# Patient Record
Sex: Male | Born: 1935 | Race: White | Hispanic: No | Marital: Married | Smoking: Never smoker
Health system: Southern US, Community
[De-identification: ages and names within clinical notes are randomized; demographics above are authoritative.]

## PROBLEM LIST (undated history)

## (undated) DIAGNOSIS — E079 Disorder of thyroid, unspecified: Secondary | ICD-10-CM

## (undated) DIAGNOSIS — D649 Anemia, unspecified: Secondary | ICD-10-CM

## (undated) DIAGNOSIS — I4891 Unspecified atrial fibrillation: Secondary | ICD-10-CM

## (undated) DIAGNOSIS — G500 Trigeminal neuralgia: Secondary | ICD-10-CM

## (undated) DIAGNOSIS — N400 Enlarged prostate without lower urinary tract symptoms: Secondary | ICD-10-CM

## (undated) DIAGNOSIS — N200 Calculus of kidney: Secondary | ICD-10-CM

## (undated) DIAGNOSIS — E7800 Pure hypercholesterolemia, unspecified: Secondary | ICD-10-CM

## (undated) DIAGNOSIS — J219 Acute bronchiolitis, unspecified: Secondary | ICD-10-CM

## (undated) DIAGNOSIS — I10 Essential (primary) hypertension: Secondary | ICD-10-CM

## (undated) DIAGNOSIS — J189 Pneumonia, unspecified organism: Secondary | ICD-10-CM

## (undated) DIAGNOSIS — R42 Dizziness and giddiness: Secondary | ICD-10-CM

## (undated) DIAGNOSIS — Z950 Presence of cardiac pacemaker: Secondary | ICD-10-CM

## (undated) HISTORY — DX: Benign prostatic hyperplasia without lower urinary tract symptoms: N40.0

## (undated) HISTORY — PX: CARDIAC SURGERY: SHX584

## (undated) HISTORY — DX: Unspecified atrial fibrillation: I48.91

## (undated) HISTORY — DX: Anemia, unspecified: D64.9

## (undated) HISTORY — DX: Pure hypercholesterolemia, unspecified: E78.00

## (undated) HISTORY — DX: Calculus of kidney: N20.0

## (undated) HISTORY — PX: PACEMAKER INSERTION: SHX728

## (undated) HISTORY — PX: TONSILLECTOMY: SUR1361

## (undated) HISTORY — PX: CAROTID ENDARTERECTOMY: SUR193

## (undated) HISTORY — PX: CATARACT EXTRACTION: SUR2

## (undated) HISTORY — PX: PILONIDAL CYST EXCISION: SHX744

## (undated) HISTORY — PX: HERNIA REPAIR: SHX51

## (undated) HISTORY — DX: Acute bronchiolitis, unspecified: J21.9

---

## 1995-11-18 HISTORY — PX: CHOLECYSTECTOMY: SHX55

## 1998-03-21 ENCOUNTER — Encounter: Admission: RE | Admit: 1998-03-21 | Discharge: 1998-06-19 | Payer: Self-pay | Admitting: Orthopaedic Surgery

## 1998-03-22 ENCOUNTER — Other Ambulatory Visit: Admission: RE | Admit: 1998-03-22 | Discharge: 1998-03-22 | Payer: Self-pay | Admitting: Internal Medicine

## 1998-08-04 ENCOUNTER — Inpatient Hospital Stay (HOSPITAL_COMMUNITY): Admission: EM | Admit: 1998-08-04 | Discharge: 1998-08-05 | Payer: Self-pay | Admitting: Emergency Medicine

## 1998-08-04 ENCOUNTER — Encounter: Payer: Self-pay | Admitting: Emergency Medicine

## 2000-09-03 ENCOUNTER — Ambulatory Visit (HOSPITAL_COMMUNITY): Admission: RE | Admit: 2000-09-03 | Discharge: 2000-09-03 | Payer: Self-pay | Admitting: Gastroenterology

## 2002-11-04 ENCOUNTER — Encounter: Admission: RE | Admit: 2002-11-04 | Discharge: 2002-11-04 | Payer: Self-pay | Admitting: Urology

## 2002-11-04 ENCOUNTER — Encounter: Payer: Self-pay | Admitting: Urology

## 2005-09-17 ENCOUNTER — Encounter: Admission: RE | Admit: 2005-09-17 | Discharge: 2005-09-17 | Payer: Self-pay | Admitting: Internal Medicine

## 2005-10-17 ENCOUNTER — Ambulatory Visit (HOSPITAL_COMMUNITY): Admission: RE | Admit: 2005-10-17 | Discharge: 2005-10-17 | Payer: Self-pay | Admitting: Internal Medicine

## 2006-09-30 ENCOUNTER — Encounter: Admission: RE | Admit: 2006-09-30 | Discharge: 2006-09-30 | Payer: Self-pay | Admitting: Cardiovascular Disease

## 2006-10-06 ENCOUNTER — Observation Stay (HOSPITAL_COMMUNITY): Admission: RE | Admit: 2006-10-06 | Discharge: 2006-10-07 | Payer: Self-pay | Admitting: Cardiovascular Disease

## 2006-10-17 HISTORY — PX: OTHER SURGICAL HISTORY: SHX169

## 2006-10-21 ENCOUNTER — Ambulatory Visit (HOSPITAL_COMMUNITY): Admission: RE | Admit: 2006-10-21 | Discharge: 2006-10-22 | Payer: Self-pay | Admitting: Cardiovascular Disease

## 2006-11-06 ENCOUNTER — Inpatient Hospital Stay (HOSPITAL_COMMUNITY): Admission: RE | Admit: 2006-11-06 | Discharge: 2006-11-14 | Payer: Self-pay | Admitting: Surgery

## 2006-12-01 ENCOUNTER — Encounter: Admission: RE | Admit: 2006-12-01 | Discharge: 2006-12-01 | Payer: Self-pay | Admitting: Surgery

## 2006-12-03 ENCOUNTER — Encounter (HOSPITAL_COMMUNITY): Admission: RE | Admit: 2006-12-03 | Discharge: 2007-03-03 | Payer: Self-pay | Admitting: Cardiovascular Disease

## 2006-12-21 ENCOUNTER — Ambulatory Visit: Payer: Self-pay | Admitting: Vascular Surgery

## 2006-12-30 ENCOUNTER — Encounter (INDEPENDENT_AMBULATORY_CARE_PROVIDER_SITE_OTHER): Payer: Self-pay | Admitting: Specialist

## 2006-12-30 ENCOUNTER — Ambulatory Visit: Payer: Self-pay | Admitting: Vascular Surgery

## 2006-12-30 ENCOUNTER — Inpatient Hospital Stay (HOSPITAL_COMMUNITY): Admission: RE | Admit: 2006-12-30 | Discharge: 2006-12-31 | Payer: Self-pay | Admitting: Vascular Surgery

## 2007-01-18 ENCOUNTER — Ambulatory Visit: Payer: Self-pay | Admitting: Vascular Surgery

## 2007-03-04 ENCOUNTER — Ambulatory Visit (HOSPITAL_COMMUNITY): Admission: RE | Admit: 2007-03-04 | Discharge: 2007-03-04 | Payer: Self-pay | Admitting: Urology

## 2007-03-05 ENCOUNTER — Encounter (HOSPITAL_COMMUNITY): Admission: RE | Admit: 2007-03-05 | Discharge: 2007-03-29 | Payer: Self-pay | Admitting: Cardiovascular Disease

## 2007-08-13 ENCOUNTER — Ambulatory Visit: Payer: Self-pay | Admitting: Vascular Surgery

## 2008-03-15 ENCOUNTER — Emergency Department (HOSPITAL_COMMUNITY): Admission: EM | Admit: 2008-03-15 | Discharge: 2008-03-15 | Payer: Self-pay | Admitting: Emergency Medicine

## 2010-04-19 ENCOUNTER — Ambulatory Visit: Payer: Self-pay | Admitting: Vascular Surgery

## 2010-07-03 ENCOUNTER — Inpatient Hospital Stay (HOSPITAL_COMMUNITY): Admission: EM | Admit: 2010-07-03 | Discharge: 2010-07-09 | Payer: Self-pay | Admitting: Emergency Medicine

## 2010-07-04 ENCOUNTER — Encounter (INDEPENDENT_AMBULATORY_CARE_PROVIDER_SITE_OTHER): Payer: Self-pay | Admitting: Cardiovascular Disease

## 2011-01-31 LAB — BASIC METABOLIC PANEL
BUN: 17 mg/dL (ref 6–23)
Calcium: 8.6 mg/dL (ref 8.4–10.5)
Chloride: 103 mEq/L (ref 96–112)
Creatinine, Ser: 0.95 mg/dL (ref 0.4–1.5)
GFR calc Af Amer: >60 mL/min (ref >60)

## 2011-01-31 LAB — URINALYSIS, MICROSCOPIC ONLY
Nitrite: NEGATIVE
Specific Gravity, Urine: 1.021 (ref 1.005–1.030)
Urobilinogen, UA: 1 mg/dL (ref 0.0–1.0)
pH: 6 (ref 5.0–8.0)

## 2011-01-31 LAB — CBC
HCT: 37.2 % — ABNORMAL LOW (ref 39.0–52.0)
MCH: 32.1 pg (ref 26.0–34.0)
MCV: 92.5 fL (ref 78.0–100.0)
MCV: 93.7 fL (ref 78.0–100.0)
Platelets: 156 10*3/uL (ref 150–400)
Platelets: 168 10*3/uL (ref 150–400)
RDW: 13.4 % (ref 11.5–15.5)
RDW: 13.6 % (ref 11.5–15.5)
WBC: 6.1 10*3/uL (ref 4.0–10.5)
WBC: 6.8 10*3/uL (ref 4.0–10.5)

## 2011-01-31 LAB — URINE MICROSCOPIC-ADD ON

## 2011-01-31 LAB — URINALYSIS, ROUTINE W REFLEX MICROSCOPIC
Glucose, UA: NEGATIVE mg/dL
Ketones, ur: 15 mg/dL — AB
Protein, ur: NEGATIVE mg/dL
Urobilinogen, UA: 1 mg/dL (ref 0.0–1.0)
pH: 5.5 (ref 5.0–8.0)

## 2011-01-31 LAB — COMPREHENSIVE METABOLIC PANEL
Albumin: 3.7 g/dL (ref 3.5–5.2)
BUN: 20 mg/dL (ref 6–23)
CO2: 26 mEq/L (ref 19–32)
Chloride: 108 mEq/L (ref 96–112)
GFR calc Af Amer: >60 mL/min (ref >60)
GFR calc non Af Amer: 53 mL/min — ABNORMAL LOW (ref >60)
Glucose, Bld: 155 mg/dL — ABNORMAL HIGH (ref 70–99)
Total Bilirubin: 0.7 mg/dL (ref 0.3–1.2)

## 2011-01-31 LAB — PROTIME-INR
INR: 1.4 (ref 0.00–1.49)
INR: 3.11 — ABNORMAL HIGH (ref 0.00–1.49)
Prothrombin Time: 17.4 seconds — ABNORMAL HIGH (ref 11.6–15.2)
Prothrombin Time: 21 seconds — ABNORMAL HIGH (ref 11.6–15.2)
Prothrombin Time: 27.2 seconds — ABNORMAL HIGH (ref 11.6–15.2)
Prothrombin Time: 31.5 seconds — ABNORMAL HIGH (ref 11.6–15.2)
Prothrombin Time: 32.1 seconds — ABNORMAL HIGH (ref 11.6–15.2)

## 2011-01-31 LAB — CK TOTAL AND CKMB (NOT AT ARMC)
CK, MB: 3.1 ng/mL (ref 0.3–4.0)
Total CK: 85 U/L (ref 7–232)

## 2011-01-31 LAB — APTT: aPTT: 34 seconds (ref 24–37)

## 2011-01-31 LAB — CARDIAC PANEL(CRET KIN+CKTOT+MB+TROPI)
CK, MB: 2.2 ng/mL (ref 0.3–4.0)
Relative Index: INVALID (ref 0.0–2.5)
Total CK: 62 U/L (ref 7–232)
Troponin I: 0.01 ng/mL (ref 0.00–0.06)

## 2011-01-31 LAB — DIFFERENTIAL
Basophils Absolute: 0 10*3/uL (ref 0.0–0.1)
Basophils Relative: 0 % (ref 0–1)
Eosinophils Absolute: 0.1 10*3/uL (ref 0.0–0.7)
Eosinophils Relative: 2 % (ref 0–5)
Lymphocytes Relative: 21 % (ref 12–46)
Monocytes Relative: 13 % — ABNORMAL HIGH (ref 3–12)

## 2011-01-31 LAB — POCT CARDIAC MARKERS
CKMB, poc: 2.3 ng/mL (ref 1.0–8.0)
Myoglobin, poc: 135 ng/mL (ref 12–200)
Myoglobin, poc: 163 ng/mL (ref 12–200)
Troponin i, poc: <0.05 ng/mL (ref 0.00–0.09)
Troponin i, poc: <0.05 ng/mL (ref 0.00–0.09)

## 2011-01-31 LAB — URINE CULTURE

## 2011-04-01 NOTE — Assessment & Plan Note (Signed)
OFFICE VISIT   Jerry Barber, Jerry Barber  DOB:  January 10, 1925                                       08/13/2007  [id number]#:[id number]   The patient presents today for evaluation and followup of his right  carotid endarterectomy from February 2008.  He has returned to his usual  baseline.  He has had no neurological deficits.  He has had 3 different  eye surgeries since my last visit with him.  He has not had any cardiac  difficulties.  He specifically denies any amaurosis fugax, transient  ischemic attack, or stroke.  He does have a slight amount of peri  incisional numbness persisting.   PHYSICAL EXAM:  Blood pressure is 134/77, pulse 61, respirations 18.  His radial pulses are 2+.  His carotid incision is well healed.  He has  no bruits bilaterally.  Heart is regular rate and rhythm.  I evaluated  his most recent duplex with him from [facility],  revealing no evidence of stenosis in his right endarterectomy site and  moderate 50-69% stenosis in his left carotid system.  This was from  April of 2008.  He reports that he is having ultrasounds at [facility]  every 6 months to a year.  He will continue his followup with them and  will see [place] on an as needed basis.   Larina Earthly, M.D.  Electronically Signed   TFE/MEDQ  D:  08/13/2007  T:  08/13/2007  Job:  [id number]   cc:   Lucky Cowboy, M.D.  Richard A. Alanda Amass, M.D.

## 2011-04-01 NOTE — Assessment & Plan Note (Signed)
OFFICE VISIT   Jerry Barber, Jerry Barber  DOB:  11/19/1924                                       04/19/2010  [id number]#:[id number]   The patient presents today for follow-up of his extracranial  cerebrovascular occlusive disease.  He is well-known to me from a prior  right carotid endarterectomy for severe asymptomatic carotid disease in  February 2008.  He has had no difficulties since then.  He has had  ongoing follow-up with noninvasive studies at Dr. Kandis Cocking office  since that time.  He is here to discuss his most recent study with me.  He denies any neurologic deficits.  He reports his only main limitation  currently is shortness of breath with exertion.  He specifically denies  any amaurosis fugax, transient ischemic attack or stroke.   He does have history of hypertension, did have a prior coronary bypass  grafting in 2007, did have a heart attack in 1984.  He does have history  of premature atherosclerotic disease in his mother.   SOCIAL HISTORY:  He is married.  He is retired.  He does not smoke or  drink alcohol.   REVIEW OF SYSTEMS:  Is noted in his chart.  He is in chronic atrial  fibrillation on Coumadin.   PHYSICAL EXAMINATION:  A well-developed, well-nourished white male  appearing stated age.  Blood pressure 134/81, pulse 69, respirations 18.  He is no in acute distress.  HEENT:  Normal.  Chest:  Clear bilaterally.  Heart:  Irregular rate.  His carotid arteries are without bruits.  He  has a well-healed right carotid incision.  His radial pulses are 2+.  Musculoskeletal:  No major deformities or cyanosis.  Neurologic:  No  focal weakness or paresthesias.  Skin:  Without ulcers or rashes.   I reviewed his duplex with him. This does show no significant change.  He is at the moderate level of stenosis in the left carotid in the 60%  to 79% range.  He has a widely patent endarterectomy on the right.   I have recommended that he continue his every  6 month Duplex follow-up  with Dr. Alanda Amass.  I would not recommend any change in his current  observation since he has had no significant progression.  I reviewed  symptoms of carotid disease with him.  He will notify us should symptoms  occur.     Larina Earthly, M.D.  Electronically Signed   TFE/MEDQ  D:  04/19/2010  T:  04/22/2010  Job:  [id number]   cc:   Gerlene Burdock A. Alanda Amass, M.D.  Dr. Vivia Birmingham

## 2011-04-04 NOTE — Cardiovascular Report (Signed)
NAME:  Jerry Barber, Jerry Barber NO.:  [id number]   MEDICAL RECORD NO.:  [id number]          PATIENT TYPE:  INP   LOCATION:  4703                         FACILITY:  [facility]   PHYSICIAN:  Nanetta Batty, M.D.   DATE OF BIRTH:  [birth year]   DATE OF PROCEDURE:  10/21/2006  DATE OF DISCHARGE:                            CARDIAC CATHETERIZATION   Jerry Barber is a 77 year old gentleman followed by Dr. Alanda Amass with a  history of the DMI in 1984 treated medically.  He has had a carotid  Doppler suggesting significant carotid stenosis.  He was admitted for  diagnostic carotid coronary angiography October 13, 2006, which  revealed a total RCA, tortuous iliacs, 70% renal artery stenosis, normal  LV function.  At that time, I was unable to cannulate his left system  because of tortuosity.  He presents now for angiography of his left  coronary system via the right brachial approach.   DESCRIPTION OF PROCEDURE:  The patient was brought to the second floor  [facility] in the postabsorptive state.  He was  premedicated with p.o. Valium.  His right brachial artery/antecubital  area was prepped and shaved in the usual sterile fashion. We used 1%  Xylocaine for local anesthesia.  A 6-French short brachial sheath was  inserted into the left brachial artery using standard Seldinger  technique and after access was obtained with a Smart needle and a  0.035 Wholey wire.  A 6-French left 4 Judkins catheter was then used to  obtain angiogram of the left coronary system.   HEMODYNAMICS:  Aortic systolic pressure 125, diastolic pressure 75.   SELECTIVE CORONARY ANGIOGRAPHY:  1. Left main normal.  2. LAD; the LAD gave off a large first diagonal branch in its proximal      portion at the takeoff of a large septal perforator.  This was a      hypodense area at a trifurcation with probably 80-90% stenosis.      Just distal to this there was a 90-95% calcified focal stenosis.      The  LAD reached the apex.  3. Circumflex; left circumflex basically gave off a large marginal      branch which had a 60% proximal segmental stenosis.  There was a      atrial branch that provided grade III collaterals to the known      occluded RCA.   IMPRESSION:  Jerry Barber has three-vessel disease with normal LV  function.  While his circumflex is not critical, his LAD diagonal  bifurcation and distal LAD do not appear to be percutaneously  revascularizable because of calcification and tortuosity.  I believe  that if he requires endarterectomy, he should be bypassed first.  If the  thought is that he will be treated medically, then his carotids should  be fixed percutaneously under the Capture II protocol as high risk  endarterectomy requiring carotid stenting.   Sheath was sewn securely in place.  The guidewire and catheter were  removed.  ACT was measured greater than 200.  We administered 2500 units  of heparin via the side  arm sheath prior to angiography.  The patient  left the lab in stable condition.   PLANS:  Remove the sheath once the ACT falls below 170.  The patient  will be discharged home 4 hours later and will follow up with Dr.  Alanda Amass in approximately one week.      Nanetta Batty, M.D.  Electronically Signed     JB/MEDQ  D:  10/21/2006  T:  10/21/2006  Job:  [id number]   cc:   2nd Floor [facility]  [facility]  Richard A. Alanda Amass, M.D.  Lucky Cowboy, M.D.

## 2011-04-04 NOTE — Cardiovascular Report (Signed)
NAME:  [name], [name] NO.:  [id number]   MEDICAL RECORD NO.:  [id number]          PATIENT TYPE:  OIB   LOCATION:  2807                         FACILITY:  [facility]   PHYSICIAN:  Nanetta Batty, M.D.   DATE OF BIRTH:  [birth year]   DATE OF PROCEDURE:  10/06/2006  DATE OF DISCHARGE:                              CARDIAC CATHETERIZATION   [name] is a 77 year old gentleman with a history of remote TMI in 1984  with an occluded RCA, scar on functional testing with preserved EF.  Cardiac  catheterization is being performed at the time of cerebral angiography to  define his cardiovascular risk prior to anticipated carotid  revascularization.   Attempts were made to use the already-accessed right femoral artery but  because of tortuosity, catheters were unable be torqued into the coronary  arteries.  Access was obtained on the left using a 6-French sheath.  The  patient received 2000 units of heparin.  A 6-French modified right Amplatz  was used to obtain right coronary artery angiography.  The left coronary  system was unable be accessed using multiple catheters.  A pigtail was used  for left ventriculography.   HEMODYNAMIC RESULTS:  Aortic systolic pressure 128 with diastolic pressure  of 55.  Left ventricular systolic pressure 127, end-diastolic pressure 17.   LEFT VENTRICULOGRAPHY:  RAO left ventriculogram was performed using 20 mL of  Visipaque dye at 10 mL/sec.  The overall LVEF was estimated at approximate  50% with mild global hypokinesia.   SELECTIVE CORONARY ANGIOGRAPHY:  The right coronary was selectively  visualized with an AR-1 catheter.  Was heavily calcified fluoroscopically,  occluded ostially with homocollaterals, with high-grade proximal stenosis.  The mid vessel was occluded with homocollaterals as well.   Left main was never able to be cannulated using the left #4 Judkins, left #5  Judkins, LS 4.5, XB-4.  I believe because of the shape of the  patient's  route and the tortuosity of his iliac arteries that he will need a brachial  approach to obtain angiographic documentation of his left coronary system.   ACT was measured and both sheaths were removed.  Pressure was held on the  groin to achieve hemostasis.  The the patient left the lab in stable  condition.  Plans will be to hydrate him overnight.  He will be discharged  home on the morning and will be seen back in 1-2 weeks for follow-up.  He  will then have his left coronary tree scheduled via the left brachial  approach.      Nanetta Batty, M.D.  Electronically Signed     JB/MEDQ  D:  10/06/2006  T:  10/06/2006  Job:  [id number]   cc:   Second Floor Peripheral Angio. Suite  [facility]  Richard A. Alanda Amass, M.D.  Lucky Cowboy, M.D.

## 2011-04-04 NOTE — Discharge Summary (Signed)
NAME:  Jerry Barber, HEMME NO.:  [id number]   MEDICAL RECORD NO.:  [id number]          PATIENT TYPE:  INP   LOCATION:  3306                         FACILITY:  [facility]   PHYSICIAN:  Larina Earthly, M.D.    DATE OF BIRTH:  1925-03-14   DATE OF ADMISSION:  12/30/2006  DATE OF DISCHARGE:  12/31/2006                               DISCHARGE SUMMARY   HISTORY OF PRESENT ILLNESS:  The patient is a 77 year old white male  status post coronary artery bypass grafting on November 06, 2006 by Dr.  Laneta Simmers at which time he was known to have bilateral carotid disease.  He  had a carotid duplex done August 20, 2006 showing a right-sided internal  carotid artery stenosis of 70-99% as well as a left internal carotid  artery stenosis of 70-99%.  Due to this finding he was recommended to  proceed with bilateral staged endarterectomies and was admitted this  hospitalization for right carotid endarterectomy.   PAST MEDICAL HISTORY:  1. Coronary artery disease.  2. Hypertension.  3. Hyperlipidemia.  4. Benign prostatic hyperplasia.  5. Left vertebral stenosis.  6. Bilateral extracranial cerebrovascular occlusion disease.  7. Right renal artery stenosis.  8. History of postoperative atrial fibrillation following coronary      revascularization.  9. Status post urethral/prostate trauma with significant hematuria      prior to coronary artery bypass graft.  10.History of myocardial infarction in 1984.   PAST SURGICAL HISTORY:  Includes coronary artery bypass grafting on  November 06, 2006 by Dr. Laneta Simmers.   OTHER DIAGNOSES:  Include tonsillectomy, cholecystectomy, bilateral  hernia repair and pilonidal cyst removal.   ALLERGIES:  NO KNOWN DRUG ALLERGIES.   MEDICATIONS PRIOR TO ADMISSION:  1. Zyrtec.  2. Mucinex.  3. Pravastatin 40 mg daily.  4. Flomax 0.4 mg daily.  5. Avodart 0.5 mg daily.  6. Metoprolol 12.5 mg daily.   FAMILY HISTORY:  Please see history and physical done at the  time of  admission.   SOCIAL HISTORY:  Please see history and physical done at the time of  admission.   REVIEW OF SYMPTOMS:  Please see history and physical done at the time of  admission.   PHYSICAL EXAMINATION:  Please see history and physical done at the time  of admission.   HOSPITAL COURSE:  The patient was admitted electively and on December 30, 2006 he was taken to the operating room at which time he underwent  the following procedure:  Right carotid endarterectomy with Dacron patch  angioplasty.  The procedure was performed by Gretta Began, MD, tolerated  well, and he was taken to the postanesthesia care unit in stable  condition.   POSTOPERATIVE HOSPITAL COURSE:  Patient has done well.  He has  maintained stable hemodynamics, however, has had some bradycardia with  prolonged PR interval which he was known to have previously, but it was  felt by Dr. Clarene Duke that Lopressor should be placed on hold.  His blood  pressure has been stable.  He also has had some occasional PVCs on the  rhythm monitor.  His laboratory  values are currently pending and will be  checked prior to discharge.  He remains neurologically intact with no  new focal findings.  His incision is healing well without evidence of  ongoing difficulties, specifically no evidence of bleeding or signs of  infection.  He has remained afebrile.  He has tolerated a routine  advancement in activity, commensurate for level of postoperative  convalescence using routine protocols.  His Foley has been discontinued  and he has been able to urinate without difficulty.  He has been  ambulating without difficulty.  Tentatively he is felt to be stable for  discharge later on today's date, December 31, 2006 as long as he has no  difficulties with the standard regimen and his laboratory values are  stable.   CONDITION ON DISCHARGE:  Stable, improving.   FINAL DIAGNOSIS:  Extracranial cerebrovascular occlusive disease as   described above, now status post right carotid endarterectomy with plans  for future left-sided procedure.   OTHER DIAGNOSES:  Include:  1. Sinus bradycardia with first-degree atrioventricular block.  2. Other diagnoses as listed per the history.   MEDICATIONS ON DISCHARGE:  He is to resume his preoperative doses of  pravastatin, Flomax, Avodart, fish oil, flax seed oil, folic acid,  aspirin and other vitamins.  For pain he received a prescription for  Tylox 1 q.6 h. p.r.n. as needed.   FOLLOW UP:  Followup will include with Dr. Arbie Cookey in two weeks.  Dr.  Bosie Helper office will contact the patient with this arrangement.      Rowe Clack, P.A.-C.      Larina Earthly, M.D.  Electronically Signed    WEG/MEDQ  D:  12/31/2006  T:  01/01/2007  Job:  [id number]   cc:   [facility]

## 2011-04-04 NOTE — Discharge Summary (Signed)
NAME:  Jerry Barber, Jerry Barber NO.:  [id number]   MEDICAL RECORD NO.:  [id number]          PATIENT TYPE:  INP   LOCATION:  2037                         FACILITY:  [facility]   PHYSICIAN:  Jerry Barber, P.A.     DATE OF BIRTH:  10/13/1925   DATE OF ADMISSION:  11/06/2006  DATE OF DISCHARGE:                               DISCHARGE SUMMARY   PRIMARY ADMITTING DIAGNOSIS:  Severe three-vessel coronary artery  disease.   ADDITIONAL/DISCHARGE DIAGNOSES:  1. Severe three-vessel coronary artery disease.  2. Hypertension.  3. Dyslipidemia.  4. Benign prostatic hypertrophy.  5. Bilateral internal carotid artery stenoses.  6. Left vertebral stenosis.  7. Right renal artery stenosis.  8. Postoperative atrial fibrillation.  9. Postoperative hematuria secondary to traumatic Foley catheter      insertion.  10.Postoperative anemia.   PROCEDURES PERFORMED:  1. Coronary artery bypass grafting times five (left internal mammary      artery to the LAD, saphenous vein graft to the diagonal, saphenous      vein graft to the obtuse marginal, saphenous vein graft      sequentially to the posterior descending and posterolateral branch      of the right coronary artery).  2. Endoscopic vein harvest left leg.   HISTORY:  The patient is a 77 year old male with a history of coronary  artery disease.  He is status post an inferior myocardial infarction in  1984 and has been treated medically since that time.  He also has a  history of bilateral carotid stenoses which have been followed by duplex  scan.  He recently had a repeat scan in October of 2007 which showed a  significant increase in the velocities in both the right and left  carotid arteries at around 70-99%.  Given these findings it was felt  that he should undergo cardiac catheterization and cerebral angiography  to better assess the degree of stenosis.  This was performed on October 06, 2006 and showed a left ventricular ejection  fraction of 50% with  mild global hypokinesis.  The right coronary artery was heavily  calcified and occluded at its ostium with bridging collaterals.  There  was a high-grade proximal stenosis and the mid part the vessel was  occluded again with bridging collaterals.  The left main coronary could  not be cannulated due to tortuosity of the iliac arteries.  Carotid  angiography showed an 80% smooth proximal right internal carotid artery  stenosis and a 70% proximal left internal carotid artery stenosis.  There is as an 80% ostial left vertebral stenosis.  Intracranial anatomy  appeared normal on that side.  He was subsequently brought back to the  cath lab on October 21, 2006 and underwent coronary angiography of the  left coronary system via a brachial approach.  This showed an 80-90%  proximal LAD trifurcation stenosis at the takeoff of a large diagonal  and a large septal perforator.  There was also a 90-95% calcified focal  stenosis just beyond this.  The left circumflex had a large marginal  branch that had a 60% proximal stenosis.  Because  of his multivessel  disease which had been asymptomatic as well as significant bilateral  internal carotid artery stenosis, he was referred to Dr. Evelene Croon  for consideration of surgical revascularization.  Dr. Laneta Simmers reviewed  his films and agreed that his best course of action to prevent further  ischemia or infarction was indicated.  He did not feel that concomitant  carotid endarterectomy was indicated given the degree of stenosis in the  carotid and the fact that he was asymptomatic.  He explained the risks,  benefits and alternatives of surgery to the patient and his family and  he agreed to proceed.   HOSPITAL COURSE:  [name] was admitted to [facility] on  11/06/2006 and was taken to the operating room where he underwent CABG  times five as described in detail above performed by Dr. Laneta Simmers.  He  tolerated the procedure  well and was transferred to the SICU in stable  condition.  He was able to be extubated shortly after surgery.  He was  somewhat vasodilated and required a Levophed drip postoperatively.  On  postop day number one he was hemodynamically stable and his drips were  weaned.  He was off all drips by postop day two.  He experienced gross  hematuria which was felt to be secondary to a traumatic Foley catheter  insertion and it was felt that his Foley catheter should be continued.  When the hematuria did not resolve a urology consult was obtained.  The  patient was seen by Dr. Retta Diones.  He replaced his 16-French catheter  for a 22-French Foley catheter and irrigated a few small clots.  It was  felt that the patient had sustained some urethral and prostatic trauma  but that this would resolve without further treatment.  A conservative  approach was recommended and it was felt that the catheter should be  left in for an additional day.  In the interim the patient developed  atrial fibrillation and was started on an amiodarone drip.  He was able  to convert to normal sinus rhythm and was subsequently switched to a  p.o. dose of amiodarone.  He also experienced an acute blood loss anemia  and was transfused two units of packed red blood cells.  He was also  transfused a unit of platelets for thrombocytopenia.  He was able to be  transferred to the floor on postop day four.  His Foley catheter was  discontinued and since that time he has been voiding without difficulty  independently and has had no significant hematuria since then.  He is  progressing well otherwise.  He has been started on iron and folic acid  for his mild blood loss anemia and this has remained stable.  He has  remained in normal sinus rhythm since his conversion.  He has been  volume overloaded and has been diuresed back down to just below his preoperative weight, although on physical exam he still retains some  lower extremity  edema.  He has been ambulating in the halls without  problem.  He has been started on a low-dose beta blocker, however, his  blood pressure has not been high enough to tolerate restart of his ACE  inhibitor at this time.  His incisions are all healing well.  He is  tolerating a regular diet and is having normal bowel and bladder  function.  His labs on 11/11/2006 show hemoglobin of 8.2, hematocrit 24,  platelets 132, white count 6.8, sodium 138,  potassium 3.7, BUN 22,  creatinine 1.0.  It was felt that if he continues to remain stable over  the next 24 hours he will hopefully be ready for discharge home on  11/12/1996.   DISCHARGE MEDICATIONS:  Are as follows:  1. Enteric-coated aspirin 325 mg daily.  2. Toprol XL 25 mg daily.  3. Pravastatin 40 mg daily.  4. Flomax 0.4 mg daily.  5. Amiodarone 200 mg b.i.d.  6. Nu-Iron 150 mg b.i.d.  7. Lasix 40 mg daily times one week.  8. K-Dur 20 mEq daily times one week.  9. He is to continue folic acid, vitamin C, a multivitamin, fish oil      and flax seed oil as directed at home.  10.Ultram 50-100 mg q.4-6h. p.r.n. for pain.   DISCHARGE INSTRUCTIONS:  He is asked to refrain from driving, heavy  lifting or strenuous activity.  He may continue ambulating daily and  using his incentive spirometer.  He may shower daily and clean his  incisions with soap and water.  He will continue a low-fat, low-sodium  diet.   DISCHARGE FOLLOWUP:  He will see Dr. Alanda Amass back in the office in two  weeks and should call for an appointment.  He will be contacted by the  CVTS office with an appointment for a chest x-ray at [facility] as well as an appointment with Dr. Laneta Simmers in three weeks.  He  should also make an appointment see Dr.  Patsi Sears as directed.  In the interim if he experiences any problems  or has questions he is asked to contact our office immediately.  He will  also be followed closely as an outpatient for his history of  bilateral  asymptomatic carotid artery stenoses and this will need to be addressed  in the future.      Jerry Barber, P.A.     GC/MEDQ  D:  11/11/2006  T:  11/11/2006  Job:  [id number]   cc:   Gerlene Burdock A. Alanda Amass, M.D.

## 2011-04-04 NOTE — Discharge Summary (Signed)
NAME:  Jerry Barber, Jerry Barber NO.:  [id number]   MEDICAL RECORD NO.:  [id number]          PATIENT TYPE:  INP   LOCATION:  4703                         FACILITY:  [facility]   PHYSICIAN:  Nanetta Batty, M.D.   DATE OF BIRTH:  10-05-1925   DATE OF ADMISSION:  DATE OF DISCHARGE:  10/22/2006                               DISCHARGE SUMMARY   DISCHARGE DIAGNOSES:  1. Coronary artery disease, status post catheterization yesterday.      Patient had a very tight calcified lesion of the distal left      anterior descending.  There was 80 to 90% stenosis lesion of the      trifurcation of the left anterior descending and distal to this      area, was a 90 to 95% calcified focal stenosis.  The circumflex was      a vessel with 60% proximal segmental stenosis, and the right      coronary artery was totally occluded, but there were collaterals of      atrial branch of the circumflex giving the supply to the occluded      right coronary artery.  2. Known peripheral vascular disease, status post recent PE angio with      bilateral internal carotid artery stenosis and left vertebral      stenosis, as well as right renal artery stenosis.  3. Hypertension.  4. Dyslipidemia.  5. Benign prostatic hypertrophy.   HISTORY OF PRESENT ILLNESS AND HOSPITAL:  This is a 76 year old patient  of Dr. Alanda Amass who was admitted to the hospital for coronary  angiography.  Procedure was performed by Dr. Allyson Sabal.  Based on this cath,  Dr. Allyson Sabal suggested the patient shoulder undergo CABG prior to  endarterectomy.  If decision will be to proceed with medical therapy,  his carotid artery should be fixed percutaneously under the CAVATAS-2  protocol as a high-risk endarterectomy requiring carotid stenting.   This time, coronary angiography was performed using the right brachial  artery access, and patient tolerated the procedure well.  The next  morning, his chest site was mildly ecchymotic, but there was no  palpable  hematoma.  The site was soft to touch.   His CBC and BMP did not show any abnormalities.  BUN was 11, creatinine  0.9.  Hemoglobin 11.4, hematocrit 32.9.   Dr. Allyson Sabal assessed the patient in the morning deemed him stable to go  home.   MEDICATIONS:  1. Plavix 75 mg daily.  2. Aspirin 81 mg daily.  3. Folic acid daily.  4. Fish oil 1000 mg daily.  5. Pravachol 40 mg daily.  6. Proscar 5 mg daily.  7. Labetalol 50 mg b.i.d.  8. Altace 2.5 mg daily.  9. Flomax 0.4 mg daily.  10.Multivitamins and vitamin C daily as before.   DISCHARGE DIET:  Low-fat, low-cholesterol diet.   DISCHARGE ACTIVITIES:  Patient was not allowed to drive 5 days postcath.  He was instructed to wash his right arm cath site carefully and report  any increased pain, swelling, redness, oozing, or drainage to our  office.   DISCHARGE FOLLOWUP:  Dr. Alanda Amass will see patient on December 14 at  11:30 a.m.      Raymon Mutton, P.A.      Nanetta Batty, M.D.  Electronically Signed    MK/MEDQ  D:  10/22/2006  T:  10/23/2006  Job:  [id number]   cc:   [facility]

## 2011-04-04 NOTE — Consult Note (Signed)
NAME:  Jerry Barber, Jerry Barber NO.:  [id number]   MEDICAL RECORD NO.:  [id number]          PATIENT TYPE:  INP   LOCATION:  2315                         FACILITY:  [facility]   PHYSICIAN:  Bertram Millard. Dahlstedt, M.D.DATE OF BIRTH:  July 09, 1925   DATE OF CONSULTATION:  11/09/2006  DATE OF DISCHARGE:                                 CONSULTATION   REASON FOR CONSULTATION:  Blood in urine.   BRIEF HISTORY:  This 77 year old gentleman is 3 days status post CABG x5  by Dr. Evelene Croon.  By report, there was a traumatic catheter  placement.  He had gross hematuria initially.  The patient has had  significant continued hematuria with his recent hemoglobin dropping to  7.4.  In addition, platelet count is 90,000.  He has been on aspirin  which has been stopped.   He denies any prior significant urologic history.  He is seen on a  regular basis by Dr. Patsi Sears for BPH but denies any prior hematuria  except on one instance when he had another Foley catheter placed.   Currently he is comfortable.  He has a Naval architect  catheter in place.   This was removed.  It was replaced with a 22-French Foley catheter.  A  few small clots were hand irrigated.  The irrigant was eventually  crystal clear.   IMPRESSION:  Urethral/prostatic trauma with significant hematuria,  basically resolved.   PLAN:  1. I would leave the catheter in for another day.  2. If the catheter drainage is fairly clear, I think it is okay to go      ahead and discontinue that.  3. I will have the on-call physician on [name] follow up on      Jerry Barber.      Bertram Millard. Dahlstedt, M.D.  Electronically Signed     SMD/MEDQ  D:  11/09/2006  T:  11/09/2006  Job:  [id number]   cc:   Evelene Croon, M.D.  Richard A. Alanda Amass, M.D.  Lucky Cowboy, M.D.  Sigmund I. Patsi Sears, M.D.

## 2011-04-04 NOTE — Op Note (Signed)
NAME:  DRAGAN, TAMBURRINO NO.:  [id number]   MEDICAL RECORD NO.:  [id number]          PATIENT TYPE:  AMB   LOCATION:  DAY                          FACILITY:  [facility]   PHYSICIAN:  Sigmund I. Patsi Sears, M.D.DATE OF BIRTH:  09/28/1925   DATE OF PROCEDURE:  03/04/2007  DATE OF DISCHARGE:                               OPERATIVE REPORT   PREOPERATIVE DIAGNOSIS:  Impacted multiple left ureterovesical junction  stones.   POSTOPERATIVE DIAGNOSIS:  Impacted multiple left ureterovesical junction  stones.   OPERATION:  Cystourethroscopy, left retrograde pyelogram with  interpretation, left ureteroscopy, basket extraction of multiple left  ureteral calculi, left double J catheter (6-French x 26 cm).   SURGEON:  Sigmund I. Patsi Sears, M.D.   ANESTHESIA:  General LMA.   PREPARATION:  After the appropriate preanesthesia, the patient is  brought to the operating room and placed on the operating room table in  the dorsal supine position where general LMA anesthesia was induced.  He  was then replaced in the dorsal lithotomy position where the pubis was  prepped with Betadine solution and draped in the usual fashion.   REVIEW OF HISTORY:  This 77 year old male has a history of bilateral  renal stones, but has had left flank and left lower quadrant pain  intermittently over the last three weeks, with CT scan showing multiple  lower left UV junction stones, with proximal hydronephrosis.  He has  been unable to pass the stone using maximal medical therapy and now  presents after cardiology clearance for basket extraction of stones.   PROCEDURE:  Cystourethroscopy was accomplished and shows trilobar BPH  with trabeculation within the bladder but no cellule formation.  There  was no bladder stone or tumor noted.  There was marked edema of the left  ureteral orifice.  Clear efflux was seen from the right orifice.  No  efflux was seen from the left orifice.  The left orifice was,  however,  identified within the edematous area, and retrograde pyelogram was  performed, which showed multiple stones at the intramural ureter and the  left UV junction.  Proximal hydronephrosis was also identified.  A  guidewire was passed in the renal pelvis, and a 6-French ureteroscope  was passed into the lower mid ureter.  Multiple stones were basket  extracted from the ureter.  A guidewire was previously passed into the  renal pelvis.  The ureteroscope was removed and the cystoscope was then  placed over the guidewire.  A 6-French x 26 cm double J catheter was  passed and coiled in the renal pelvis and also in the bladder.  The  patient tolerated the procedure well.  He was given IV Toradol,  awakened, and taken to the recovery room in good condition.      Sigmund I. Patsi Sears, M.D.  Electronically Signed    SIT/MEDQ  D:  03/04/2007  T:  03/04/2007  Job:  [id number]

## 2011-04-04 NOTE — Op Note (Signed)
NAME:  Jerry Barber, Jerry Barber NO.:  [id number]   MEDICAL RECORD NO.:  [id number]          PATIENT TYPE:  INP   LOCATION:  3306                         FACILITY:  [facility]   PHYSICIAN:  Larina Earthly, M.D.    DATE OF BIRTH:  04-Feb-1925   DATE OF PROCEDURE:  12/30/2006  DATE OF DISCHARGE:                               OPERATIVE REPORT   PREOPERATIVE DIAGNOSIS:  Severe asymptomatic right internal carotid  artery stenosis.   POSTOPERATIVE DIAGNOSIS:  Severe asymptomatic right internal carotid  artery stenosis.   PHYSICAL EXAMINATION:  Right carotid endarterectomy and Dacron patch  angioplasty.   SURGEON:  Larina Earthly, M.D.   ASSISTANT:  Rowe Clack, P.A.-C.   ANESTHESIA:  MAC.   COMPLICATIONS:  None.   DISPOSITION:  To recovery room, stable.   PROCEDURE IN DETAIL:  The patient was taken to the operating room and  placed in supine position, where the area of the right neck was prepped  and draped in the usual sterile fashion.  An incision was made in the  anterior sternocleidomastoid and was carried down through the platysma  with electrocautery.  The sternocleidomastoid was reflected posteriorly  and the carotid sheath was opened.  The facial vein was ligated with 2-0  silk ties and divided.  The common carotid artery was encircled with an  umbilical tape and a Rumel tourniquet.  The dissection was extended onto  the bifurcation, and the superior thyroid artery was encircled with a 2-  0 silk Potts tie.  The external carotid was encircled with a blue  Vesseloop, and the internal carotid was encircled with encircled with a  umbilical tape and a Rumel tourniquet.  The vagus and hypoglossal nerves  were identified and preserved.  The patient was given 8000 units of  intravenous heparin, and after adequate circulation time, the internal,  external and common carotid arteries were occluded.  The common carotid  artery was opened with an 11 blade and extended  longitudinally with  Potts scissors through the plaque, and onto the internal carotid artery  with Potts scissors.  A 10 shunt was passed up the internal carotid,  allowed to backbleed, and then down the common carotid, where it was  secured with Rumel tourniquets.  The endarterectomy was begun on the  common carotid artery, and the plaque was divided proximally with Potts  scissors.  The endarterectomy was extended onto the bifurcation, and the  external carotid was endarterectomized with eversion technique, and the  internal carotid was endarterectomized in an open fashion.  Remaining  atheromatous debris was removed from the endarterectomy plane.  A  Finesse Hemashield Dacron patch was brought onto the field and was sewn  as a patch angioplasty with a running 6-0 Prolene suture.  Prior to  completion of the anastomosis, the shunt was removed and the usual  flushing maneuvers were undertaken.  The anastomosis was then completed  in the external, followed by the common, and finally the internal  carotid artery occlusion clamp was removed.  Excellent flow  characteristics were noted with the hand-held Doppler in the internal  and external carotid arteries.  The patient was given 50 mg of protamine  to reverse the heparin.  The wounds were irrigated with saline.  Hemostasis with electrocautery.  The wounds were closed with 3-0 Vicryl  to reapproximate the sternocleidomastoid over the carotid sheath.  Next,  the platysma was closed with a running 3-0 Vicryl suture,  and, finally, the skin was closed with a 4-0 subcuticular Vicryl stitch.  A sterile dressing was applied, and the patient was awakened in the  operating room neurologically intact and was transferred to the recovery  room in stable condition.      Larina Earthly, M.D.  Electronically Signed     TFE/MEDQ  D:  12/30/2006  T:  12/30/2006  Job:  [id number]   cc:   Nanetta Batty, M.D.

## 2011-04-04 NOTE — Op Note (Signed)
NAME:  MANNIX, KROEKER NO.:  [id number]   MEDICAL RECORD NO.:  [id number]          PATIENT TYPE:  INP   LOCATION:  2315                         FACILITY:  [facility]   PHYSICIAN:  Evelene Croon, M.D.     DATE OF BIRTH:  Jun 10, 1925   DATE OF PROCEDURE:  11/06/2006  DATE OF DISCHARGE:                               OPERATIVE REPORT   PREOPERATIVE DIAGNOSIS:  Severe three-vessel coronary artery disease.   POSTOPERATIVE DIAGNOSIS:  Severe three-vessel coronary artery disease.   OPERATIVE PROCEDURE:  Median sternotomy, extracorporeal circulation,  coronary bypass graft surgery x5 using the left internal mammary artery  graft to the left anterior descending coronary, with a saphenous vein  graft to the diagonal branch of the LAD, a saphenous vein graft to the  obtuse marginal branch of left circumflex coronary artery, and a  sequential saphenous vein graft to the posterior descending and  posterolateral branches of the right coronary artery.  Endoscopic vein  harvesting from the left leg.   SURGEON:  Evelene Croon, M.D.   ASSISTANT:  Jerold Coombe, P.A.-C.   ANESTHESIA:  General endotracheal.   CLINICAL HISTORY:  This patient is a 77 year old gentleman with a  history of coronary disease status post inferior myocardial infarction  in 1984 treated medically.  He had normal ejection fraction by  echocardiogram and a negative stress test showing only inferior scar in  2003.  He also has a history of carotid disease and has been followed by  duplex.  His duplex scan on August 20, 2006 showed a significant  increase in velocities in both his right and left carotid arteries.  This study was read as bilateral 70 to 99% internal carotid artery  stenosis.  Given these findings, he subsequently underwent cardiac  catheterization and cerebral angiography to better assess the degree of  stenosis.  The cardiac catheterization was performed on October 06, 2006 which showed  left ventricular ejection fraction of 50% with mild  global hypokinesis.  The right coronary artery was heavily calcified and  occluded at its ostium with bridging collaterals.  There was high-grade  proximal stenosis, and the mid part of the vessel was occluded again  with bridging collaterals as well.  The left main coronary artery could  not be cannulated due to tortuosity of his iliac arteries.  Carotid  angiography showed an 80% smooth proximal right internal carotid artery  stenosis and a 70% proximal left internal carotid artery stenosis.  There is 80% ostial left vertebral stenosis.  Intracranial anatomy  appeared normal on both sides.  He was subsequently brought back to the  cath lab on October 21, 2006 and underwent coronary angiography of the  left coronary system through a brachial approach.  This showed an 80-90%  proximal LAD trifurcation stenosis at the takeoff a large diagonal and a  large septal perforator.  This area was hypodense.  There was also 90-  95% calcified focal stenosis just beyond this.  The left circumflex had  a large marginal branch that had 60% proximal stenosis.  After review of  the angiogram and examination of  the patient, it was felt that coronary  artery bypass graft surgery was the best treatment to prevent further  ischemia and infarction.  I did not feel that concomitant carotid  endarterectomy was indicated given the patient's degree of stenosis in  the carotid arteries and the fact that he was asymptomatic.  I discussed  the operative procedure of coronary artery bypass graft surgery with the  patient and his wife.  We discussed the alternatives, benefits, and  risks, including but not limited to bleeding, blood transfusion,  infection, stroke, myocardial infarction, graft failure, and death.  They understood and agreed to proceed.   OPERATIVE PROCEDURE:  The patient was taken to the operating room and  placed on the table in the supine  position.  After induction of general  endotracheal anesthesia, a Foley catheter was placed in the bladder  using sterile technique.  Then the chest, abdomen, and both lower  extremities were prepped and draped in usual sterile manner.  The chest  was opened through a median sternotomy incision.  The pericardium was  opened in the midline.  Examination of the heart showed good ventricular  contractility.  The ascending aorta had no palpable plaques in it.   Then the left internal mammary artery was harvested from the chest wall  as a pedicle graft.  This is a medium caliber vessel with excellent  blood flow through it.  At the same time, a segment of greater saphenous  vein was harvested from the left leg using endoscopic vein harvest  technique.  This vein was of large caliber and good quality.  There was  slight varicosity in one area.  We initially examined the saphenous vein  adjacent to the right knee, but this vein was smaller and bifurcated at  the knee level into two smaller veins.  It was not harvested.   Then the patient was heparinized, and when an adequate activated  clotting time was achieved, the distal ascending aorta was cannulated  using a 20-French aortic cannula for arterial inflow.  Venous outflow  was achieved using a two-stage venous cannula through the right atrial  appendage.  An antegrade cardioplegia and vent cannula was inserted in  the aortic root.   The patient was placed on cardiopulmonary bypass, and the distal  coronary artery was identified.  The LAD was a large graftable vessel.  It was heavily diseased at its proximal and midportion but distally  appeared free of disease.  The diagonal branch was also a large  graftable vessel.  The obtuse marginal was a large graftable vessel with  no distal disease in it.  The right coronary artery had moderate size  posterior descending and larger posterolateral branch.  There was evidence of old inferior  infarction with patchy scar present.   Then the aorta was crossclamped, and 1000 mL of cold blood antegrade  cardioplegia was administered in the aortic root with quick arrest of  the heart.  Systemic hypothermia to 28 degrees centigrade and topical  hypothermic iced saline was used.  A temperature probe was placed in the  septum and an insulating pad in the pericardium.   The first distal anastomosis was performed of the posterior descending  coronary.  The internal diameter was about 1.75 mm.  The conduit used  was a segment of greater saphenous vein.  The anastomosis was performed  in a sequential side-to-side manner using continuous 7-0 Prolene suture.  Flow was noted through the graft and was excellent.  A second distal anastomosis was performed of the posterolateral branch.  The internal diameter was about 2 mm.  The conduit used was the same  segment of greater saphenous vein.  The anastomosis was performed in a  sequential end-to-side manner using continuous 7-0 Prolene suture.  Flow  was noted through the graft and was excellent.  Then another dose of  cardioplegia was given down vein grafts and in the aortic root.   The third distal anastomosis was performed of the obtuse marginal  branch.  The internal diameter was 2 mm.  The conduit used was a second  segment of greater saphenous vein.  The anastomosis was performed in an  end-to-side manner using continuous 7-0 Prolene suture.  Flow was noted  through the graft and was excellent.   The fourth distal anastomosis was performed of the diagonal branch.  The  internal diameter was 1.75 mm.  The conduit used was a third segment of  greater saphenous vein.  The anastomosis was performed in an end-to-side  manner using continuous 7-0 Prolene suture.  Flow was noted through the  graft and was excellent.   The fifth distal anastomosis was performed of the distal LAD.  The  internal diameter was 2 mm.  The conduit used was the  left internal  mammary graft, and this was brought through an opening in the left  pericardium anterior to the phrenic nerve.  This was anastomosed to the  LAD in an end-to-side manner using continuous 8-0 Prolene suture.  The  pedicle was sutured to the epicardium with 6-0 Prolene sutures.  The  patient was rewarmed to 37 degrees centigrade.  With the crossclamp in  place, the three proximal vein graft anastomoses were performed of the  aortic root in an end-to-side manner using continuous 6-0 Prolene  suture.  Then the clamp was removed the from mammary pedicle.  There was  rapid warming of the ventricular septum and return of spontaneous  ventricular fibrillation.  The crossclamp was removed, with time of 84  minutes.  The patient was in a sinus arrest rhythm.  The proximal and  distal anastomoses appeared hemostatic while the grafts were  satisfactory.  Graft markers were placed around the proximal anastomoses.  Two temporary right ventricular and right atrial pacing  wires were placed and brought out through the skin.   The patient was placed in DDD pacing mode and was then weaned from  cardiopulmonary bypass on no inotropic agents.  Total bypass time was  100 minutes.  Cardiac function appeared excellent with a cardiac output  of 6 liters per minute.  Protamine was given, and the venous and aortic  cannulas were removed without difficulty.  Hemostasis was achieved.  Three chest tubes were placed, with a tube in the posterior pericardium,  one in the anterior mediastinum, and one in the left pleural space.  The  pericardium was loosely reapproximated over the heart.  The sternum was  closed with #6 stainless steel wires.  The fascia was closed with  continuous #1 Vicryl suture.  The subcutaneous tissue was closed with  continuous 2-0 Vicryl and skin with a 3-0 Vicryl subcuticular closure.  The lower extremity vein harvest site was closed in layers in a similar  manner.  Sponge,  needle, and instrument counts were correct according to  the scrub nurse.  Dry sterile dressings were applied over the incisions,  around chest tubes which were hooked to Pleur-Evac suction.   The patient remained hemodynamically  stable and was transferred to the  SICU in guarded but stable condition.  This is the end of dictation of  operative note on Jerry Barber, medical record number [id number] by  Dr. Evelene Croon.      Evelene Croon, M.D.  Electronically Signed     BB/MEDQ  D:  11/06/2006  T:  11/08/2006  Job:  [id number]   cc:   Gerlene Burdock A. Alanda Amass, M.D.  Cath lab

## 2011-04-04 NOTE — Cardiovascular Report (Signed)
NAME:  Jerry Barber, Jerry Barber NO.:  [id number]   MEDICAL RECORD NO.:  [id number]          PATIENT TYPE:  OIB   LOCATION:  2807                         FACILITY:  [facility]   PHYSICIAN:  Nanetta Batty, M.D.   DATE OF BIRTH:  28-Jul-1925   DATE OF PROCEDURE:  DATE OF DISCHARGE:                              CARDIAC CATHETERIZATION   Mr. Bolyard is a 77 year old gentleman patient of Dr. Alanda Amass and Dr.  Oneta Rack, with history of CAD status post inferior wall myocardial infarction  in 1984, treated medically.  He has normal EF by 2-D echo and negative for  ischemia with inferior scar September 2003.  His other problems include  hyperlipidemia.  He has had duplex surveillance of his carotids which have  shown progression of disease.  He presents now for cerebral angiography to  define his anatomy and the suitability for carotid artery stenting.  Cardiac  cath will also be done to define his coronary anatomy and cardiovascular  risk.   DESCRIPTION OF PROCEDURE:  The patient brought to the second floor [facility] in postabsorptive state.  Not premedicated.  Both  groins were prepped and shaved in the usual sterile fashion, 1% Xylocaine  was used for local anesthesia.  A 6-French sheath was inserted into the  right femoral artery using standard Seldinger technique.  The femoral artery  was incredibly tortuous, and thus a 34 cm right tip sheath was used.  A 5-  [place] tennis racket catheter, JV1 and V-TEK catheters were used for arch  angiography, distal abdominal aortography, selective right and left carotid  and left vertebral angiography.  The Visipaque dye was used entirety of the  case.  Aortic pressures monitored during the case.   ANGIOGRAPHIC RESULTS:  1. Aortic arch; the patient a type 3 arch.  2. Right carotid; the patient had 80% smooth proximal right ICA stenosis.      The intracranial anatomy appeared normal though it was underfilled.  3. Left  carotid; carotid had a 70% proximal ICA stenosis.  The      intracranial anatomy appeared normal.  4. Left vertebral; left vertebral had an 80% ostial stenosis.  The      intracranial posterior circulation appeared normal.   IMPRESSION:  Mr. Mckeithan has moderate bilateral ICA stenosis.  I suspect for  future stroke prophylaxis he will need at least a right carotid  endarterectomy.  We will also perform diagnostic coronary angiography to  define his cardiovascular risk.      Nanetta Batty, M.D.  Electronically Signed    JB/MEDQ  D:  10/06/2006  T:  10/06/2006  Job:  [id number]   cc:   [facility]  [facility]  Richard A. Alanda Amass, M.D.  Lucky Cowboy, M.D.

## 2011-04-04 NOTE — Discharge Summary (Signed)
NAME:  Jerry Barber, Jerry Barber NO.:  [id number]   MEDICAL RECORD NO.:  [id number]          PATIENT TYPE:  INP   LOCATION:  2008                         FACILITY:  [facility]   PHYSICIAN:  Lezlie Octave, N.P.     DATE OF BIRTH:  [birth year]   DATE OF ADMISSION:  10/06/2006  DATE OF DISCHARGE:  10/07/2006                               DISCHARGE SUMMARY   [name] is a 77 year old male patient who is a patient of Dr.  Susa Griffins who was seen by Dr. Allyson Sabal secondary to abnormal  carotid Dopplers.  He also recently has had an abnormal Cardiolite test  showing superimposed ischemia inferiorly.  It was decided that he should  undergo a combined PV and coronary angiogram.  Thus, he was brought to  the hospital October 06, 2006, and he underwent a catheterization  showing right 100% ostial stenosis and 90% proximal and 100% mid-RCA  stenosis.  He had a 70% right renal artery stenosis.  He had bilateral  RCA stenosis with carotid 80% on the right and 70% on the left.  He had  80% ostial vertebral on the left.  The right vertebral was not shot.  He  had normal LV function.  He had tortuous iliacs, and his left system  coronaries was not shot secondary to difficulties in intubation.  He was  kept overnight and hydrated.  The following day, his blood pressure was  90/47, pulse was 57, respirations were 20, temperature was 98.  His  hemoglobin was 11., hematocrit was 32.9, WBC was 6.3, platelets were  162, sodium was 138, potassium was 4.3, BUN was 11, creatinine was 0.9,  glucose was 123.  He was seen by Dr. Allyson Sabal and considered stable for  discharge home.   DISCHARGE MEDICATIONS:  1. Plavix 75 mg once per day.  2. Flomax 0.4 mg one time per day.  3. Aspirin 81 mg one time per day.  4. Folic acid 400 mg one time per day.  5. Vitamin C 1000 mg one time per day.  6. Multivitamins daily.  7. Pravastatin 40 mg one time per day at bedtime.  8. Proscar 5 mg one time per day.  9.  Labetalol 50 mg one time per day.  10.Altace 2.5 mg one time per day.  11.Fish oil and Flax seed oil as per prior.   DISCHARGE DIAGNOSES:  1. Coronary artery disease with a total right coronary artery, prior      history of a diaphragmatic myocardial infarction in 1984.  2. Arteriosclerotic peripheral vascular disease with bilateral carotid      disease right renal artery stenosis and vertebral artery disease on      the left.  3. Dyslipidemia.  4. Hypotension.  5. History of benign prostatic hypertrophy.  6. Osteoarthritis.  7. History of nephrolithiasis.  8. Multiple surgeries including tonsillectomy, hernia surgery,      cholecystectomy.      Lezlie Octave, N.P.     BB/MEDQ  D:  12/04/2006  T:  12/05/2006  Job:  [id number]   cc:   Gerlene Burdock A. Alanda Amass, M.D.  Chrissie Noa  Oneta Rack, M.D.  Sigmund I. Patsi Sears, M.D.

## 2011-04-04 NOTE — Procedures (Signed)
[facility]  Patient:    Jerry Barber, Jerry Barber                    MRN: [id number] Proc. Date: 09/03/00 Adm. Date:  [id number] Attending:  Orland Mustard CC:         Marinus Maw, M.D.   Procedure Report  PROCEDURE:  Colonoscopy.  MEDICATIONS:  Fentanyl 62.5 mcg, Versed 6 mg IV.  SCOPE:  Olympus adult video colonoscope.  INDICATION:  Strong family history of colon cancer.  DESCRIPTION OF PROCEDURE:  The procedure had been explained to the patient and consent obtained.  With the patient in the left lateral decubitus position, the Olympus adult video colonoscope was inserted and advanced under direct visualization.  The prep was excellent.  We were able to reach the cecum using some abdominal pressure without any great difficulty.  The cecum was identified by identification of he ileocecal valve and the cats claw configuration of the cecum.  The scope was withdrawn.  The cecum, ascending colon, hepatic flexure, transverse colon, splenic flexure, descending, and sigmoid colon were seen well upon removal.  No polyps or other lesions were seen throughout the colon.  The scope was withdrawn.  The patient tolerated the procedure well, was maintained on low-flow oxygen and pulse oximetry throughout the procedure with no obvious problem.  ASSESSMENT:  No evidence of colon polyps or other neoplasia.  PLAN:  Due to his strong family history of colon cancer, recommend a five-year repeat colonoscopy. DD:  09/03/00 TD:  09/03/00 Job: [id number]

## 2011-04-04 NOTE — H&P (Signed)
NAME:  Jerry Barber, Jerry Barber NO.:  [id number]   MEDICAL RECORD NO.:  [id number]          PATIENT TYPE:  INP   LOCATION:  NA                           FACILITY:  [facility]   PHYSICIAN:  Larina Earthly, M.D.    DATE OF BIRTH:  [birth year]   DATE OF ADMISSION:  DATE OF DISCHARGE:                              HISTORY & PHYSICAL   CHIEF COMPLAINT:  Right ICA stenosis.   HISTORY OF PRESENT ILLNESS:  Mr. Eden is a pleasant 77 year old  Caucasian male who recently underwent coronary artery bypass grafting by  Dr. Laneta Simmers November 06, 2006.  The patient initially was diagnosed with  bilateral carotid artery stenosis.  He was originally was going to  undergo right carotid endarterectomy and was undergoing cardiac  clearance which came out positive for an abnormal stress test.  The  patient then underwent cardiac catheterization revealing severe three-  vessel coronary artery disease, leading to coronary artery bypass  grafting.  The patient did develop postoperative atrial fibrillation but  was discharged home in normal sinus rhythm.  His postoperative course  was pretty much unremarkable.  The patient was seen and evaluated by Dr.  early postoperatively December 21, 2006.  The patient presents today for  his history and physical.  He denies any headaches, nausea, vomiting,  vertigo, dizziness, numbness, tingling, muscle weakness, dysarthria,  dysphagia, visual changes, chest pain or shortness of breath.  His most  recent carotid duplex ultrasound was done August 21, 1999 showing right  ICA stenosis 70-99% with left ICA stenosis at 70-99%.   PAST MEDICAL HISTORY:  1. Coronary artery disease with history of myocardial infarction.      1984.  2. Hypertension.  3. Hyperlipidemia.  4. Benign prostatic hypertrophy.  5. Left vertebral stenosis.  6. Bilateral ICA stenosis.  7. Right renal artery stenosis.  8. Postoperative atrial fibrillation.  9. Status post 3-0 prostate trauma with  significant hematuria pre-      CABG.   PAST SURGICAL HISTORY:  1. Status post coronary bypass grafting by Dr. Laneta Simmers November 06, 2006.  2. Status post tonsillectomy.  3. Status post cholecystectomy.  4. Status post bilateral hernia repair.  5. Status post pilonidal cyst removal.   ALLERGIES:  NO KNOWN DRUG ALLERGIES.   MEDICATIONS:  1. Zyrtec daily.  2. Mucinex daily.  3. Pravastatin 40 mg daily.  4. Flomax 0.4 mg daily.  5. Avodart 0.5 mg daily.  6. Metoprolol 12.5 mg daily.   SOCIAL HISTORY:  The patient is married with no children.  Lives at home  with his wife.  He denies any alcohol or tobacco use.  He is retired and  resides in [place].   FAMILY HISTORY:  Noncontributory.   REVIEW OF SYSTEMS:  The patient denies any recent fevers, chills, night  sweats.  Denies recent changes in vision, hearing, difficulty  swallowing.  He does complain of runny nose with cough.  He has been  placed on Zyrtec and Mucinex.  Dr. Arbie Cookey aware.  Denies any shortness of  breath, hemoptysis or wheezing.  Denies any chest  pain, palpitations,  orthopnea or proximal nocturnal dyspnea.  Denies any nausea, vomiting,  abdominal pain, changes in bowel movements, diarrhea, constipation, mild  hematemesis, hematochezia.  He denies any urgency, frequency, dysuria,  hematuria.  Denies any muscle aches or pains.   PHYSICAL EXAM:  GENERAL:  Well-developed, well-nourished white male in  no acute distress.  VITAL SIGNS:  Blood pressure 130/70, pulse of 68, respirations 18.  HEENT:  Normocephalic, atraumatic.  Pupils equal, round, reactive to  light accommodation.  Extraocular movements intact.  Oral mucosa is pink  and moist.  NECK:  Supple.  Bilateral carotid bruits noted.  RESPIRATORY:  Clear to auscultation bilaterally.  CARDIAC:  Regular rate and rhythm with a well-healed sternotomy scar.  ABDOMEN:  Bowel sounds x4.  Soft, nontender on palpation.  GENITOURINARY:  Deferred.  RECTAL:   Deferred.  EXTREMITIES:  The patient's bilateral upper and lower extremities are  warm and well-perfused.  He has 2+ bilateral radial, femoral DP and PT  pulses noted.  NEUROLOGIC:  Cranial nerves II-XII intact.  Patient is alert and or x4.  Gait steady.  Muscle strength 5/5 upper and lower extremities  bilaterally.   IMPRESSION AND PLAN:  The patient seen with bilateral ICA stenosis.  The  patient was seen and evaluated by Dr. Arbie Cookey.  Dr. Arbie Cookey discussed with  the patient undergoing right carotid endarterectomy.  He discussed the  risks and benefits with patient.  The patient acknowledges  understanding, agrees to proceed.  Surgery is scheduled for December 30, 2006 by Dr. Arbie Cookey.      Theda Belfast, [place]      Larina Earthly, M.D.  Electronically Signed    KMD/MEDQ  D:  12/28/2006  T:  12/28/2006  Job:  [id number]   cc:   Larina Earthly, M.D.

## 2011-04-23 ENCOUNTER — Other Ambulatory Visit: Payer: Self-pay | Admitting: Geriatric Medicine

## 2011-04-25 ENCOUNTER — Ambulatory Visit
Admission: RE | Admit: 2011-04-25 | Discharge: 2011-04-25 | Disposition: A | Discharge status: Home / Self Care | Payer: Medicare Other | Source: Ambulatory Visit | Attending: Geriatric Medicine | Admitting: Geriatric Medicine

## 2011-04-25 ENCOUNTER — Other Ambulatory Visit: Payer: Self-pay

## 2011-04-25 MED ORDER — IOHEXOL 300 MG/ML  SOLN: 75.0000 mL | Freq: Once | INTRAMUSCULAR | Status: AC | PRN

## 2011-08-12 LAB — CBC
HCT: 36.2 — ABNORMAL LOW
Hemoglobin: 12.9 — ABNORMAL LOW
WBC: 8.8

## 2011-08-12 LAB — DIFFERENTIAL
Eosinophils Relative: 1
Lymphocytes Relative: 11 — ABNORMAL LOW
Lymphs Abs: 1
Monocytes Absolute: 0.6
Monocytes Relative: 7

## 2011-08-12 LAB — POCT CARDIAC MARKERS
Myoglobin, poc: 127
Myoglobin, poc: 85.8
Operator id: 285841
Troponin i, poc: <0.05

## 2011-08-12 LAB — POCT I-STAT, CHEM 8
BUN: 19
Calcium, Ion: 1.17
Creatinine, Ser: 1.2
Glucose, Bld: 98
TCO2: 26

## 2011-08-13 IMAGING — CR DG CHEST 2V
2 series · 2 of 2 positions shown · non-contrast
Comparison: 07/03/2010

CLINICAL DATA: Status post pacemaker placement, sore arm

CHEST - 2 VIEW

[w chest pa]
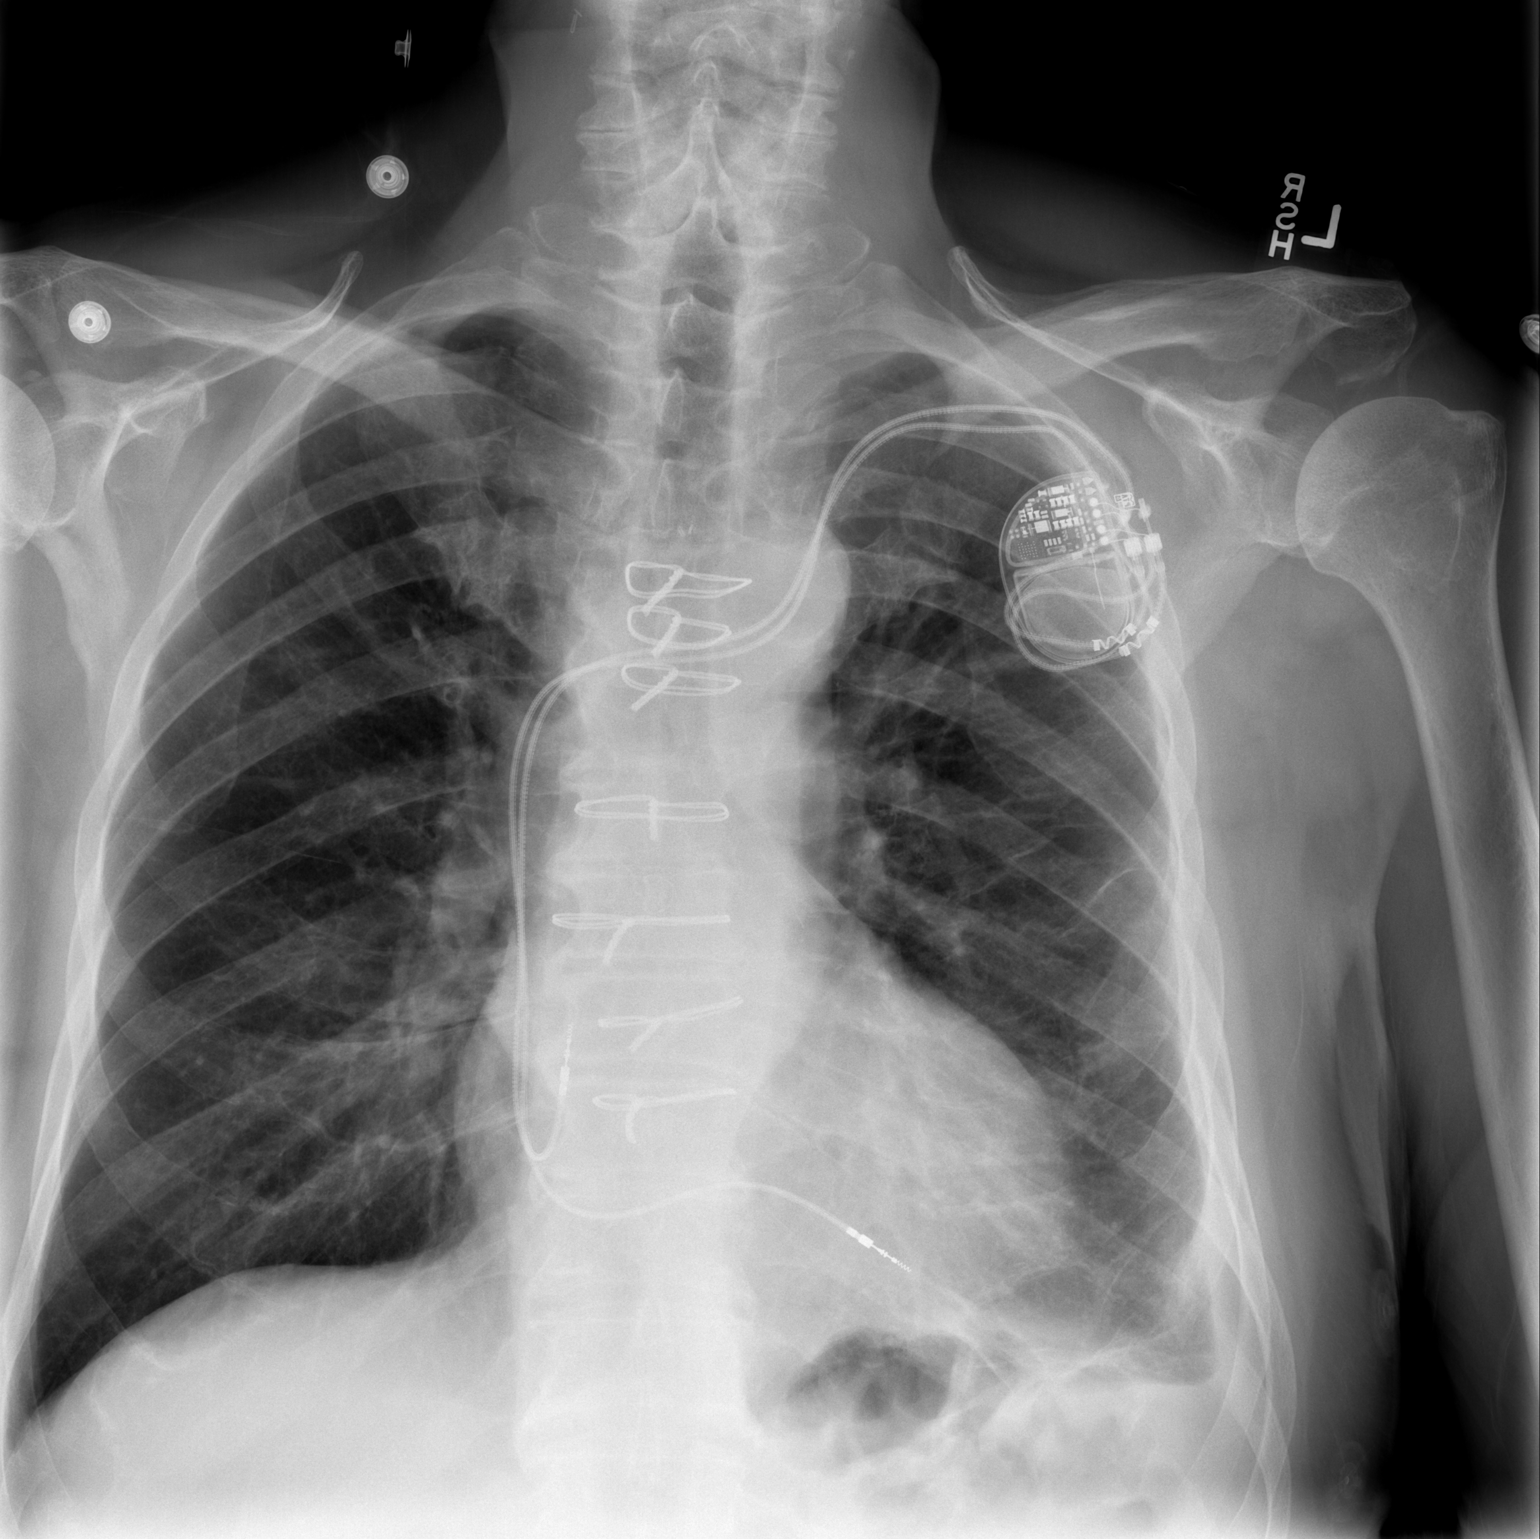

[w chest lat]
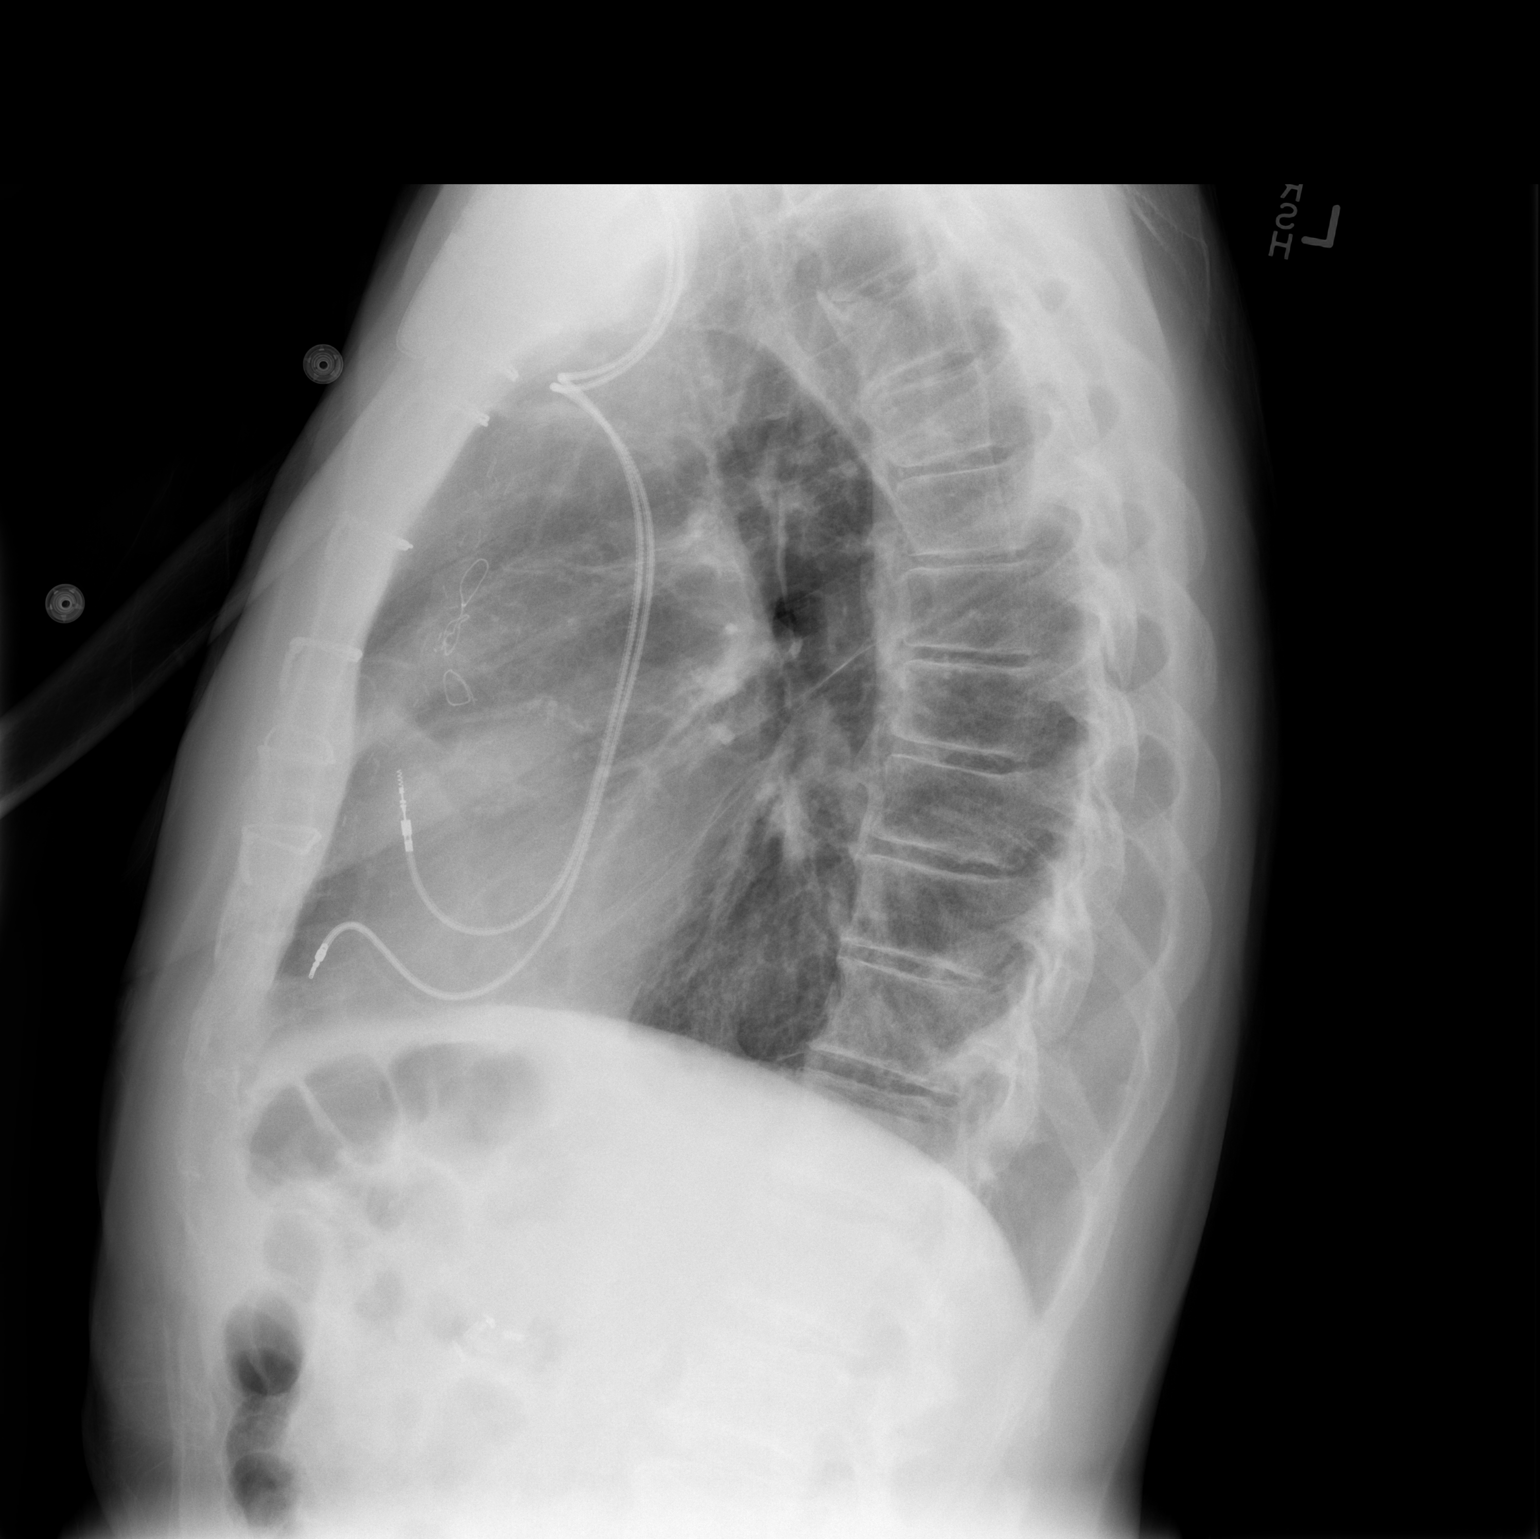

[2 of 2 positions shown; findings below may reference images not displayed]

FINDINGS: Upper normal size of cardiac silhouette.
Postsurgical changes of median sternotomy.
Interval sequential pacemaker placement, leads projecting at right
atrium and right ventricle.
Tortuous calcified thoracic aorta.
Pulmonary vascularity normal.
Mild emphysematous changes with minimal left basilar atelectasis
and minimal left pleural effusion.
Lungs otherwise clear.
No pneumothorax.
IMPRESSION: Status post median sternotomy and pacemaker placement.
Emphysematous changes with left basilar atelectasis and minimal
left pleural effusion.

## 2011-09-07 ENCOUNTER — Emergency Department (HOSPITAL_COMMUNITY)
Admission: EM | Admit: 2011-09-07 | Discharge: 2011-09-07 | Disposition: A | Discharge status: Home / Self Care | Payer: Medicare Other | Attending: Emergency Medicine | Admitting: Emergency Medicine

## 2011-09-07 DIAGNOSIS — Z7982 Long term (current) use of aspirin: Secondary | ICD-10-CM | POA: Insufficient documentation

## 2011-09-07 DIAGNOSIS — I252 Old myocardial infarction: Secondary | ICD-10-CM | POA: Insufficient documentation

## 2011-09-07 DIAGNOSIS — Z79899 Other long term (current) drug therapy: Secondary | ICD-10-CM | POA: Insufficient documentation

## 2011-09-07 DIAGNOSIS — E785 Hyperlipidemia, unspecified: Secondary | ICD-10-CM | POA: Insufficient documentation

## 2011-09-07 DIAGNOSIS — I10 Essential (primary) hypertension: Secondary | ICD-10-CM | POA: Insufficient documentation

## 2011-09-07 DIAGNOSIS — I2510 Atherosclerotic heart disease of native coronary artery without angina pectoris: Secondary | ICD-10-CM | POA: Insufficient documentation

## 2011-09-07 DIAGNOSIS — M542 Cervicalgia: Secondary | ICD-10-CM | POA: Insufficient documentation

## 2011-10-15 ENCOUNTER — Other Ambulatory Visit (HOSPITAL_COMMUNITY): Payer: Self-pay | Admitting: Geriatric Medicine

## 2011-10-15 DIAGNOSIS — T17998A Other foreign object in respiratory tract, part unspecified causing other injury, initial encounter: Secondary | ICD-10-CM

## 2011-10-20 ENCOUNTER — Ambulatory Visit (HOSPITAL_COMMUNITY)
Admission: RE | Admit: 2011-10-20 | Discharge: 2011-10-20 | Disposition: A | Discharge status: Home / Self Care | Payer: Medicare Other | Source: Ambulatory Visit | Attending: Geriatric Medicine | Admitting: Geriatric Medicine

## 2011-10-20 DIAGNOSIS — R1310 Dysphagia, unspecified: Secondary | ICD-10-CM | POA: Insufficient documentation

## 2011-10-20 DIAGNOSIS — R1313 Dysphagia, pharyngeal phase: Secondary | ICD-10-CM | POA: Insufficient documentation

## 2011-10-20 DIAGNOSIS — T17998A Other foreign object in respiratory tract, part unspecified causing other injury, initial encounter: Secondary | ICD-10-CM

## 2011-10-20 MED ORDER — DARBEPOETIN ALFA-POLYSORBATE 150 MCG/0.3ML IJ SOLN
INTRAMUSCULAR | Status: AC
  Filled 2011-10-20: qty 0.3

## 2011-10-20 NOTE — Procedures (Signed)
Modified Barium Swallow Procedure Note Patient Details  Name: Jerry Barber MRN: [id number] Date of Birth: [birth year]  Today's Date: 10/20/2011 Time:  -     Past Medical History: No past medical history on file. Past Surgical History: No past surgical history on file. HPI:    Date of Onset:  (6 months) Other Pertinent Information: Pt reports persistent dry cough, hoarse voice, post nasal drip, feeling of food getting stuck, xerostomia, poor appetite with weight loss, trigemenal neuralgia since May 2012 being successfully treated with medication.  Type of Study: Initial MBS Diet Prior to this Study: Regular;Thin liquids  Recommendation/Prognosis  Clinical Impression Dysphagia Diagnosis: Suspected primary esophageal dysphagia;Mild pharyngeal phase dysphagia Clinical impression: Pt presents with moderate pharyngeal residuals and trace silent penetration and aspiration of thin liquids without apparent weakness of pharyngeal structures.  Suspect pt with primary esophageal dysphagia as evidenced by appearance of dysmotility with solids and pt complaints (globus, dry cough, post nasal drip, voice change).  Suspect pt with decreased pharyngeal pressures resulting in pharyngeal residuals and incomplete airway protection due to possible esophageal dysfunction. Also suspect intermittent laryngopharyngeal reflux demonstrated by decreased pharyngeal and laryngeal sensation of stasis and aspirate and dysphonia. Pt may continue a regular thin diet with aspiration and esophageal precautions.  However, pt would benefit from referral to a GI for assessment of possible GER and to an ENT for assessment of LPR.   Recommendations Recommended Consults: Consider ENT evaluation;Consider GI evaluation;Consider esophageal assessment Solid Consistency: Regular Liquid Consistency: Thin Liquid Administration via: Cup Medication Administration: Whole meds with liquid Supervision: Patient able to self  feed Compensations: Small sips/bites;Multiple dry swallows after each bite/sip;Follow solids with liquid;Clear throat intermittently Postural Changes and/or Swallow Maneuvers: Seated upright 90 degrees;Upright 30-60 min after meal   Individuals Consulted Consulted and Agree with Results and Recommendations: Patient;Family member/caregiver Family Member Consulted: wife Report Sent to : Referring physician  Harlon Ditty, [place] CCC-SLP [phone]  Claudine Mouton 10/20/2011, 12:32 PM

## 2011-10-31 ENCOUNTER — Other Ambulatory Visit (HOSPITAL_COMMUNITY): Payer: Self-pay | Admitting: Gastroenterology

## 2011-11-05 ENCOUNTER — Ambulatory Visit (HOSPITAL_COMMUNITY)
Admission: RE | Admit: 2011-11-05 | Discharge: 2011-11-05 | Disposition: A | Discharge status: Home / Self Care | Payer: Medicare Other | Source: Ambulatory Visit | Attending: Gastroenterology | Admitting: Gastroenterology

## 2011-11-05 DIAGNOSIS — K224 Dyskinesia of esophagus: Secondary | ICD-10-CM | POA: Insufficient documentation

## 2011-11-05 DIAGNOSIS — R1310 Dysphagia, unspecified: Secondary | ICD-10-CM | POA: Insufficient documentation

## 2011-12-19 ENCOUNTER — Other Ambulatory Visit: Payer: Self-pay | Admitting: Geriatric Medicine

## 2011-12-19 DIAGNOSIS — R634 Abnormal weight loss: Secondary | ICD-10-CM

## 2011-12-19 DIAGNOSIS — J189 Pneumonia, unspecified organism: Secondary | ICD-10-CM

## 2011-12-22 ENCOUNTER — Ambulatory Visit
Admission: RE | Admit: 2011-12-22 | Discharge: 2011-12-22 | Disposition: A | Discharge status: Home / Self Care | Payer: Medicare Other | Source: Ambulatory Visit | Attending: Geriatric Medicine | Admitting: Geriatric Medicine

## 2011-12-22 DIAGNOSIS — R634 Abnormal weight loss: Secondary | ICD-10-CM

## 2011-12-22 DIAGNOSIS — J189 Pneumonia, unspecified organism: Secondary | ICD-10-CM

## 2011-12-22 MED ORDER — IOHEXOL 300 MG/ML  SOLN
100.0000 mL | Freq: Once | INTRAMUSCULAR | Status: AC | PRN
  Administered 2011-12-22: 100 mL via INTRAVENOUS

## 2012-01-17 ENCOUNTER — Other Ambulatory Visit: Payer: Self-pay

## 2012-01-17 ENCOUNTER — Emergency Department (HOSPITAL_COMMUNITY)
Admission: EM | Admit: 2012-01-17 | Discharge: 2012-01-17 | Disposition: A | Discharge status: Home / Self Care | Payer: Medicare Other | Attending: Emergency Medicine | Admitting: Emergency Medicine

## 2012-01-17 ENCOUNTER — Encounter (HOSPITAL_COMMUNITY): Payer: Self-pay | Admitting: *Deleted

## 2012-01-17 DIAGNOSIS — R42 Dizziness and giddiness: Secondary | ICD-10-CM

## 2012-01-17 DIAGNOSIS — I10 Essential (primary) hypertension: Secondary | ICD-10-CM | POA: Insufficient documentation

## 2012-01-17 HISTORY — DX: Trigeminal neuralgia: G50.0

## 2012-01-17 HISTORY — DX: Presence of cardiac pacemaker: Z95.0

## 2012-01-17 HISTORY — DX: Essential (primary) hypertension: I10

## 2012-01-17 HISTORY — DX: Dizziness and giddiness: R42

## 2012-01-17 HISTORY — DX: Pneumonia, unspecified organism: J18.9

## 2012-01-17 HISTORY — DX: Disorder of thyroid, unspecified: E07.9

## 2012-01-17 LAB — CBC
HCT: 32 % — ABNORMAL LOW (ref 39.0–52.0)
Hemoglobin: 11.1 g/dL — ABNORMAL LOW (ref 13.0–17.0)
MCH: 31.8 pg (ref 26.0–34.0)
MCHC: 34.7 g/dL (ref 30.0–36.0)
MCV: 91.7 fL (ref 78.0–100.0)
Platelets: 312 K/uL (ref 150–400)
RBC: 3.49 MIL/uL — ABNORMAL LOW (ref 4.22–5.81)
RDW: 13.5 % (ref 11.5–15.5)
WBC: 9.3 K/uL (ref 4.0–10.5)

## 2012-01-17 LAB — DIFFERENTIAL
Basophils Absolute: 0 K/uL (ref 0.0–0.1)
Basophils Relative: 0 % (ref 0–1)
Eosinophils Absolute: 0.2 K/uL (ref 0.0–0.7)
Eosinophils Relative: 2 % (ref 0–5)
Lymphocytes Relative: 13 % (ref 12–46)
Lymphs Abs: 1.2 K/uL (ref 0.7–4.0)
Monocytes Absolute: 1 K/uL (ref 0.1–1.0)
Monocytes Relative: 11 % (ref 3–12)
Neutro Abs: 7 K/uL (ref 1.7–7.7)
Neutrophils Relative %: 75 % (ref 43–77)

## 2012-01-17 LAB — COMPREHENSIVE METABOLIC PANEL
Albumin: 2.8 g/dL — ABNORMAL LOW (ref 3.5–5.2)
BUN: 19 mg/dL (ref 6–23)
Creatinine, Ser: 0.86 mg/dL (ref 0.50–1.35)
GFR calc Af Amer: 90 mL/min — ABNORMAL LOW (ref >90)
Glucose, Bld: 104 mg/dL — ABNORMAL HIGH (ref 70–99)
Total Bilirubin: 0.3 mg/dL (ref 0.3–1.2)
Total Protein: 7.1 g/dL (ref 6.0–8.3)

## 2012-01-17 MED ORDER — MECLIZINE HCL 25 MG PO TABS
25.0000 mg | ORAL_TABLET | Freq: Once | ORAL | Status: AC
  Administered 2012-01-17: 25 mg via ORAL
  Filled 2012-01-17: qty 1

## 2012-01-17 MED ORDER — SODIUM CHLORIDE 0.9 % IV SOLN
Freq: Once | INTRAVENOUS | Status: AC
  Administered 2012-01-17: 08:00:00 via INTRAVENOUS

## 2012-01-17 MED ORDER — MECLIZINE HCL 25 MG PO TABS: 25.0000 mg | ORAL_TABLET | Freq: Three times a day (TID) | ORAL | Status: AC | PRN

## 2012-01-17 NOTE — ED Notes (Signed)
Informed patient and/or family of status. No voiced complaints presently. NAD.  

## 2012-01-17 NOTE — Discharge Instructions (Signed)
Benign Positional Vertigo Vertigo is a feeling that you are unsteady or that you or your surroundings are moving. Benign positional vertigo (BPV) is the most common form of vertigo. Benign means it does not have a serious cause. BPV is an upset in the balance system in your middle ear. This is troublesome but usually not serious. A viral infection or head injury are common causes, but often no cause is found. It is more common as we grow older. SYMPTOMS   Sudden dizziness happens when you move your head in different directions. Some of the problems that come with this are:  Loss of balance.     Throwing up (vomiting).     Blurred vision.     Dizziness .     Feeling sick to your stomach (nauseated).  DIAGNOSIS   Your caregiver may do some specialized testing to prove what is wrong. HOME CARE INSTRUCTIONS    Rest and eat a well-balanced diet.     Move slowly and do not make sudden body or head movements.     Do not drive a car or do any activities that could hurt you or others.     Lie down and rest. Take precautions to prevent falls.  SEEK IMMEDIATE MEDICAL CARE IF:    You develop headaches which are severe or lasting.     You develop continued vomiting.     A temporary loss or change of vision appears.     You notice temporary numbness on one side of your body.     You are temporarily unable to speak.     Temporary areas of weakness develop.     You have weakness or numbness in the face, arms, or legs.     You notice dizziness or difficulty walking.     You experience slurred speech or difficulty swallowing.  MAKE SURE YOU:    Understand these instructions.     Will watch your condition.     Will get help right away if you are not doing well or get worse.  Document Released: 08/11/2006 Document Revised: 05/19/2011 Document Reviewed: 08/20/2006 [employer] Patient Information 2012 [employer]. 

## 2012-01-17 NOTE — ED Provider Notes (Addendum)
History     CSN: [id number]  Arrival date & time 01/17/12  4098   First MD Initiated Contact with Patient 01/17/12 [phone]      Chief Complaint  Patient presents with  . Dizziness    (Consider location/radiation/quality/duration/timing/severity/associated sxs/prior treatment) The history is provided by the patient.   patient presents with dizziness which began 2 hours prior to arrival. Symptoms are worse when standing or with head movement made better with lying still. No recent vomiting, diarrhea, fever. Denies any chest pain, abdominal pain. No severe headaches. No ataxia. No recent medicine changes. No medications taken for this prior to arrival.  Past Medical History  Diagnosis Date  . Pacemaker   . Pneumonia   . Trigeminal neuralgia   . Hypertension   . Thyroid disease   . Dizziness     Past Surgical History  Procedure Date  . Pacemaker insertion   . Cardiac surgery   . Carotid endarterectomy     Right    Family History  Problem Relation Age of Onset  . Cancer Mother   . Heart failure Father     History  Substance Use Topics  . Smoking status: Never Smoker   . Smokeless tobacco: Not on file  . Alcohol Use: No      Review of Systems  All other systems reviewed and are negative.    Allergies  Review of patient's allergies indicates no known allergies.  Home Medications  No current outpatient prescriptions on file.  BP 136/80  Pulse 79  Temp(Src) 97.9 F (36.6 C) (Oral)  Resp 20  SpO2 100%  Physical Exam  Nursing note and vitals reviewed. Constitutional: He is oriented to person, place, and time. Vital signs are normal. He appears well-developed and well-nourished.  Non-toxic appearance. No distress.  HENT:  Head: Normocephalic and atraumatic.  Eyes: Conjunctivae, EOM and lids are normal. Pupils are equal, round, and reactive to light.  Neck: Normal range of motion. Neck supple. No tracheal deviation present. No mass present.  Cardiovascular:  Normal rate, regular rhythm and normal heart sounds.  Exam reveals no gallop.   No murmur heard. Pulmonary/Chest: Effort normal and breath sounds normal. No stridor. No respiratory distress. He has no decreased breath sounds. He has no wheezes. He has no rhonchi. He has no rales.  Abdominal: Soft. Normal appearance and bowel sounds are normal. He exhibits no distension. There is no tenderness. There is no rebound and no CVA tenderness.  Musculoskeletal: Normal range of motion. He exhibits no edema and no tenderness.  Neurological: He is alert and oriented to person, place, and time. He has normal strength. No cranial nerve deficit or sensory deficit. GCS eye subscore is 4. GCS verbal subscore is 5. GCS motor subscore is 6.  Skin: Skin is warm and dry. No abrasion and no rash noted.  Psychiatric: He has a normal mood and affect. His speech is normal and behavior is normal.    ED Course  Procedures (including critical care time)   Labs Reviewed  CBC  DIFFERENTIAL  COMPREHENSIVE METABOLIC PANEL   No results found.   No diagnosis found.    MDM   Date: 01/17/2012  Rate: 70  Rhythm: paced  QRS Axis: normal  Intervals: normal  ST/T Wave abnormalities: paced  Conduction Disutrbances:paced  Narrative Interpretation:   Old EKG Reviewed: changes noted   9:01 AM Pt given antivert and pt feels better--repeat neuro exam stable No concern for central vertigo ( no ataxia or neuro  findings) Pain relates right ear pressure and suspect BPV as cause of sx      Toy Baker, MD 01/17/12 1610  Toy Baker, MD 01/17/12 [phone]

## 2012-01-17 NOTE — ED Notes (Signed)
C/o dizziness upon standing this morning when getting out of bed. Reports worse with sitting & standing up & better when laying down & being still. Denies any CP, palpitations, n/v/d, fever, cold, cough. Also reports no appetite with 35 lb weight loss since June when Dx with trigeminal neuralgia.

## 2012-01-17 NOTE — ED Notes (Signed)
Upon standing for the orthostatic vitals, patient became dizzy.  Mild dizziness with sitting.

## 2012-01-17 NOTE — ED Notes (Addendum)
Woke up at 0200 am and felt fine, woke again at 0530 and experienced dizziness. H/o similar, but not usually this bad. "Takes medications that can cause dizziness". Mentions: some sob, had PNA in the fall, fell 3-4 weeks ago. Here with wife, pt of Dr. Pete Glatter (PCP) & Dr. Sandria Manly (neuro), & Dr. Alanda Amass (cards). Has a pacemaker (left chest).

## 2012-01-19 ENCOUNTER — Other Ambulatory Visit: Payer: Self-pay | Admitting: Geriatric Medicine

## 2012-01-19 DIAGNOSIS — R05 Cough: Secondary | ICD-10-CM

## 2012-01-19 DIAGNOSIS — R911 Solitary pulmonary nodule: Secondary | ICD-10-CM

## 2012-01-20 ENCOUNTER — Ambulatory Visit
Admission: RE | Admit: 2012-01-20 | Discharge: 2012-01-20 | Disposition: A | Discharge status: Home / Self Care | Payer: Medicare Other | Source: Ambulatory Visit | Attending: Geriatric Medicine | Admitting: Geriatric Medicine

## 2012-01-20 DIAGNOSIS — R911 Solitary pulmonary nodule: Secondary | ICD-10-CM

## 2012-01-20 DIAGNOSIS — R05 Cough: Secondary | ICD-10-CM

## 2012-01-23 ENCOUNTER — Telehealth: Payer: Self-pay | Admitting: Critical Care Medicine

## 2012-01-27 NOTE — Telephone Encounter (Signed)
Called and spoke with pt.  Pt states he has a pending consult appt with PW on 3/14 and just wanted to make sure he needed to bring a list of his meds with to the visit and if he also needed to bring ct reports.  Informed pt's recent ct reports and images are already in the computer so no need to bring those.  Also informed pt that he can either bring all medications that he takes with him to the visit or just a list of his medications but make sure to include dosage of meds and directions/how often he takes them. Also informed pt to bring his ins card(s) Pt verbalized understanding and denied any quesitons.

## 2012-01-29 ENCOUNTER — Encounter: Payer: Self-pay | Admitting: Critical Care Medicine

## 2012-01-29 ENCOUNTER — Ambulatory Visit (INDEPENDENT_AMBULATORY_CARE_PROVIDER_SITE_OTHER): Payer: Medicare Other | Admitting: Critical Care Medicine

## 2012-01-29 VITALS — BP 102/60 | HR 72 | Temp 97.4°F | Ht 72.0 in | Wt 140.5 lb

## 2012-01-29 DIAGNOSIS — N400 Enlarged prostate without lower urinary tract symptoms: Secondary | ICD-10-CM

## 2012-01-29 DIAGNOSIS — N200 Calculus of kidney: Secondary | ICD-10-CM

## 2012-01-29 DIAGNOSIS — I10 Essential (primary) hypertension: Secondary | ICD-10-CM

## 2012-01-29 DIAGNOSIS — J189 Pneumonia, unspecified organism: Secondary | ICD-10-CM

## 2012-01-29 DIAGNOSIS — E079 Disorder of thyroid, unspecified: Secondary | ICD-10-CM

## 2012-01-29 DIAGNOSIS — I4891 Unspecified atrial fibrillation: Secondary | ICD-10-CM

## 2012-01-29 DIAGNOSIS — G500 Trigeminal neuralgia: Secondary | ICD-10-CM | POA: Insufficient documentation

## 2012-01-29 DIAGNOSIS — J690 Pneumonitis due to inhalation of food and vomit: Secondary | ICD-10-CM | POA: Insufficient documentation

## 2012-01-29 NOTE — Patient Instructions (Addendum)
Bronchoscopy for Monday 02/02/12 at 1pm at [facility] No med changes until then Return 2 weeks to follow up results

## 2012-01-29 NOTE — Progress Notes (Signed)
Subjective:    Patient ID: Jerry Barber, male    DOB: 05/03/1925, 78 y.o.   MRN: [id number]  HPI  78 y.o. WM bilat chronic pneumonia    ?MAC Notes symptoms since early 1/13.  Still coughing but not as much. Pt notes dry hacky cough. Pt notes dyspnea as well. Notes dyspnea with any exertion.  No chest pain.  No edema in feet.  Notes some blood , not as bad as before.  Past Medical History  Diagnosis Date  . Pacemaker   . Pneumonia   . Trigeminal neuralgia   . Hypertension   . Thyroid disease   . Dizziness   . Elevated cholesterol   . Atrial fibrillation   . BPH (benign prostatic hyperplasia)   . Anemia   . Bronchiolitis   . Kidney stones      Family History  Problem Relation Age of Onset  . Cancer Mother     colon  . Heart failure Father      History   Social History  . Marital Status: Married    Spouse Name: N/A    Number of Children: 0  . Years of Education: N/A   Occupational History  .     Social History Main Topics  . Smoking status: Never Smoker   . Smokeless tobacco: Never Used  . Alcohol Use: No  . Drug Use: No  . Sexually Active: Not on file   Other Topics Concern  . Not on file   Social History Narrative  . No narrative on file     No Known Allergies   Outpatient Prescriptions Prior to Visit  Medication Sig Dispense Refill  . acetaminophen-codeine (TYLENOL #3) 300-30 MG per tablet Take 1 tablet by mouth every 6 (six) hours as needed. For pain      . ALPRAZolam (XANAX) 0.5 MG tablet Take 0.5 mg by mouth at bedtime as needed. For anxiety      . aspirin 81 MG tablet Take 81 mg by mouth daily.      . carbamazepine (CARBATROL) 100 MG 12 hr capsule Take 100 mg by mouth 2 (two) times daily.      . Cholecalciferol (VITAMIN D) 2000 UNITS tablet Take by mouth daily. 2000 in am and 1000 in pm      . clopidogrel (PLAVIX) 75 MG tablet Take 75 mg by mouth daily.      . dutasteride (AVODART) 0.5 MG capsule Take 0.5 mg by mouth daily.      . fish  oil-omega-3 fatty acids 1000 MG capsule Take 1,200 g by mouth daily.       . Flaxseed, Linseed, 1000 MG CAPS Take 1 capsule by mouth daily.       . folic acid (FOLVITE) 800 MCG tablet Take 800 mcg by mouth daily.       . levothyroxine (SYNTHROID, LEVOTHROID) 100 MCG tablet Take 100 mcg by mouth daily.      . Multiple Vitamin (MULITIVITAMIN WITH MINERALS) TABS Take 1 tablet by mouth daily.      . omeprazole (PRILOSEC) 20 MG capsule Take 20 mg by mouth as needed.       . sotalol (BETAPACE) 80 MG tablet Take 80 mg by mouth 2 (two) times daily.      . Tamsulosin HCl (FLOMAX) 0.4 MG CAPS Take 0.4 mg by mouth daily.      . pravastatin (PRAVACHOL) 20 MG tablet ON HOLD      . magnesium 30 MG tablet   Take 30 mg by mouth 2 (two) times daily.          Review of Systems  Constitutional: Positive for activity change, appetite change and unexpected weight change. Negative for fever, chills, diaphoresis and fatigue.  HENT: Positive for rhinorrhea, trouble swallowing, voice change and postnasal drip. Negative for hearing loss, ear pain, nosebleeds, congestion, sore throat, facial swelling, sneezing, mouth sores, neck pain, neck stiffness, dental problem, sinus pressure, tinnitus and ear discharge.   Eyes: Negative for photophobia, discharge, itching and visual disturbance.  Respiratory: Positive for cough, shortness of breath and wheezing. Negative for apnea, choking, chest tightness and stridor.   Cardiovascular: Positive for chest pain. Negative for palpitations and leg swelling.  Gastrointestinal: Positive for constipation. Negative for nausea, vomiting, abdominal pain, blood in stool and abdominal distention.  Genitourinary: Positive for decreased urine volume. Negative for dysuria, urgency, frequency, hematuria, flank pain and difficulty urinating.  Musculoskeletal: Positive for gait problem. Negative for myalgias, back pain, joint swelling and arthralgias.  Skin: Positive for pallor. Negative for color  change and rash.  Neurological: Positive for dizziness, weakness and light-headedness. Negative for tremors, seizures, syncope, speech difficulty, numbness and headaches.  Hematological: Negative for adenopathy. Bruises/bleeds easily.  Psychiatric/Behavioral: Negative for confusion, sleep disturbance and agitation. The patient is not nervous/anxious.        Objective:   Physical Exam  Filed Vitals:   01/29/12 1400  BP: 102/60  Pulse: 72  Temp: 97.4 F (36.3 C)  TempSrc: Oral  Height: 6' (1.829 m)  Weight: 140 lb 8 oz (63.73 kg)  SpO2: 100%    Gen:thin , in no distress,  normal affect  ENT: No lesions,  mouth clear,  oropharynx clear, no postnasal drip  Neck: No JVD, no TMG, no carotid bruits  Lungs: No use of accessory muscles, no dullness to percussion,scattered rhonchi  Cardiovascular: RRR, heart sounds normal, no murmur or gallops, no peripheral edema  Abdomen: soft and NT, no HSM,  BS normal  Musculoskeletal: No deformities, no cyanosis or clubbing  Neuro: alert, non focal  Skin: Warm, no lesions or rashes  CT chest 3/13: reviewed.         Assessment & Plan:   Pneumonia Bilateral atypical pneumonia ? MAC infection ?bronchoalveolar CA Plan Needs FOB  >>>02/02/12 No med changes until Fob resulted    Updated Medication List Outpatient Encounter Prescriptions as of 01/29/2012  Medication Sig Dispense Refill  . acetaminophen-codeine (TYLENOL #3) 300-30 MG per tablet Take 1 tablet by mouth every 6 (six) hours as needed. For pain      . ALPRAZolam (XANAX) 0.5 MG tablet Take 0.5 mg by mouth at bedtime as needed. For anxiety      . aspirin 81 MG tablet Take 81 mg by mouth daily.      . carbamazepine (CARBATROL) 100 MG 12 hr capsule Take 100 mg by mouth 2 (two) times daily.      . Cholecalciferol (VITAMIN D) 2000 UNITS tablet Take by mouth daily. 2000 in am and 1000 in pm      . clopidogrel (PLAVIX) 75 MG tablet Take 75 mg by mouth daily.      . dutasteride  (AVODART) 0.5 MG capsule Take 0.5 mg by mouth daily.      . fish oil-omega-3 fatty acids 1000 MG capsule Take 1,200 g by mouth daily.       . Flaxseed, Linseed, 1000 MG CAPS Take 1 capsule by mouth daily.       . folic   acid (FOLVITE) 800 MCG tablet Take 800 mcg by mouth daily.       . levothyroxine (SYNTHROID, LEVOTHROID) 100 MCG tablet Take 100 mcg by mouth daily.      . Magnesium 400 MG CAPS Take 1 capsule by mouth daily.      . Multiple Vitamin (MULITIVITAMIN WITH MINERALS) TABS Take 1 tablet by mouth daily.      . omeprazole (PRILOSEC) 20 MG capsule Take 20 mg by mouth as needed.       . sotalol (BETAPACE) 80 MG tablet Take 80 mg by mouth 2 (two) times daily.      . Tamsulosin HCl (FLOMAX) 0.4 MG CAPS Take 0.4 mg by mouth daily.      . pravastatin (PRAVACHOL) 20 MG tablet ON HOLD      . DISCONTD: magnesium 30 MG tablet Take 30 mg by mouth 2 (two) times daily.          

## 2012-01-29 NOTE — Assessment & Plan Note (Signed)
Bilateral atypical pneumonia ? MAC infection ?bronchoalveolar CA Plan Needs FOB  >>>02/02/12 No med changes until Fob resulted

## 2012-02-02 ENCOUNTER — Ambulatory Visit (HOSPITAL_COMMUNITY)
Admission: RE | Admit: 2012-02-02 | Discharge: 2012-02-02 | Disposition: A | Discharge status: Home / Self Care | Payer: Medicare Other | Source: Ambulatory Visit | Attending: Critical Care Medicine | Admitting: Critical Care Medicine

## 2012-02-02 ENCOUNTER — Encounter (HOSPITAL_COMMUNITY): Payer: Self-pay | Admitting: Critical Care Medicine

## 2012-02-02 ENCOUNTER — Ambulatory Visit (HOSPITAL_COMMUNITY): Payer: Medicare Other

## 2012-02-02 ENCOUNTER — Encounter (HOSPITAL_COMMUNITY)
Admission: RE | Disposition: A | Discharge status: Home / Self Care | Payer: Self-pay | Source: Ambulatory Visit | Attending: Critical Care Medicine

## 2012-02-02 DIAGNOSIS — J189 Pneumonia, unspecified organism: Secondary | ICD-10-CM

## 2012-02-02 DIAGNOSIS — J8410 Pulmonary fibrosis, unspecified: Secondary | ICD-10-CM | POA: Insufficient documentation

## 2012-02-02 DIAGNOSIS — J13 Pneumonia due to Streptococcus pneumoniae: Secondary | ICD-10-CM

## 2012-02-02 DIAGNOSIS — R042 Hemoptysis: Secondary | ICD-10-CM | POA: Diagnosis not present

## 2012-02-02 DIAGNOSIS — Z79899 Other long term (current) drug therapy: Secondary | ICD-10-CM | POA: Insufficient documentation

## 2012-02-02 DIAGNOSIS — J690 Pneumonitis due to inhalation of food and vomit: Secondary | ICD-10-CM | POA: Insufficient documentation

## 2012-02-02 DIAGNOSIS — Z7982 Long term (current) use of aspirin: Secondary | ICD-10-CM | POA: Insufficient documentation

## 2012-02-02 DIAGNOSIS — Z950 Presence of cardiac pacemaker: Secondary | ICD-10-CM | POA: Insufficient documentation

## 2012-02-02 DIAGNOSIS — I10 Essential (primary) hypertension: Secondary | ICD-10-CM | POA: Insufficient documentation

## 2012-02-02 HISTORY — PX: VIDEO BRONCHOSCOPY: SHX5072

## 2012-02-02 SURGERY — BRONCHOSCOPY, WITH FLUOROSCOPY
Anesthesia: Moderate Sedation

## 2012-02-02 MED ORDER — PHENYLEPHRINE HCL 0.25 % NA SOLN
1.0000 | Freq: Four times a day (QID) | NASAL | Status: DC | PRN
  Filled 2012-02-02: qty 15

## 2012-02-02 MED ORDER — LIDOCAINE HCL 2 % EX GEL
Freq: Once | CUTANEOUS | Status: AC
  Administered 2012-02-02: 2 via TOPICAL

## 2012-02-02 MED ORDER — PHENYLEPHRINE HCL 0.25 % NA SOLN
NASAL | Status: DC | PRN
  Administered 2012-02-02: 2 via NASAL

## 2012-02-02 MED ORDER — THROMBIN 5000 UNITS EX SOLR
CUTANEOUS | Status: DC | PRN
  Administered 2012-02-02: 5000 [IU] via TOPICAL

## 2012-02-02 MED ORDER — EPINEPHRINE HCL 0.1 MG/ML IJ SOLN
INTRAMUSCULAR | Status: DC | PRN
  Administered 2012-02-02: 1 via INTRAVENOUS

## 2012-02-02 MED ORDER — FENTANYL CITRATE 0.05 MG/ML IJ SOLN
INTRAMUSCULAR | Status: DC | PRN
  Administered 2012-02-02: 50 ug via INTRAVENOUS

## 2012-02-02 MED ORDER — MIDAZOLAM HCL 10 MG/2ML IJ SOLN
INTRAMUSCULAR | Status: DC | PRN
  Administered 2012-02-02: 2 mg via INTRAVENOUS

## 2012-02-02 MED ORDER — SODIUM CHLORIDE 0.9 % IV SOLN: Freq: Once | INTRAVENOUS | Status: DC

## 2012-02-02 MED ORDER — MIDAZOLAM HCL 10 MG/2ML IJ SOLN
INTRAMUSCULAR | Status: AC
  Filled 2012-02-02: qty 4

## 2012-02-02 MED ORDER — FENTANYL CITRATE 0.05 MG/ML IJ SOLN
INTRAMUSCULAR | Status: AC
  Filled 2012-02-02: qty 4

## 2012-02-02 NOTE — H&P (View-Only) (Signed)
Subjective:    Patient ID: Jerry Barber, male    DOB: [birth year], 78 y.o.   MRN: [id number]  HPI  78 y.o. WM bilat chronic pneumonia    ?MAC Notes symptoms since early 1/13.  Still coughing but not as much. Pt notes dry hacky cough. Pt notes dyspnea as well. Notes dyspnea with any exertion.  No chest pain.  No edema in feet.  Notes some blood , not as bad as before.  Past Medical History  Diagnosis Date  . Pacemaker   . Pneumonia   . Trigeminal neuralgia   . Hypertension   . Thyroid disease   . Dizziness   . Elevated cholesterol   . Atrial fibrillation   . BPH (benign prostatic hyperplasia)   . Anemia   . Bronchiolitis   . Kidney stones      Family History  Problem Relation Age of Onset  . Cancer Mother     colon  . Heart failure Father      History   Social History  . Marital Status: Married    Spouse Name: N/A    Number of Children: 0  . Years of Education: N/A   Occupational History  .     Social History Main Topics  . Smoking status: Never Smoker   . Smokeless tobacco: Never Used  . Alcohol Use: No  . Drug Use: No  . Sexually Active: Not on file   Other Topics Concern  . Not on file   Social History Narrative  . No narrative on file     No Known Allergies   Outpatient Prescriptions Prior to Visit  Medication Sig Dispense Refill  . acetaminophen-codeine (TYLENOL #3) 300-30 MG per tablet Take 1 tablet by mouth every 6 (six) hours as needed. For pain      . ALPRAZolam (XANAX) 0.5 MG tablet Take 0.5 mg by mouth at bedtime as needed. For anxiety      . aspirin 81 MG tablet Take 81 mg by mouth daily.      . carbamazepine (CARBATROL) 100 MG 12 hr capsule Take 100 mg by mouth 2 (two) times daily.      . Cholecalciferol (VITAMIN D) 2000 UNITS tablet Take by mouth daily. 2000 in am and 1000 in pm      . clopidogrel (PLAVIX) 75 MG tablet Take 75 mg by mouth daily.      Marland Kitchen dutasteride (AVODART) 0.5 MG capsule Take 0.5 mg by mouth daily.      . fish  oil-omega-3 fatty acids 1000 MG capsule Take 1,200 g by mouth daily.       . Flaxseed, Linseed, 1000 MG CAPS Take 1 capsule by mouth daily.       . folic acid (FOLVITE) 800 MCG tablet Take 800 mcg by mouth daily.       Marland Kitchen levothyroxine (SYNTHROID, LEVOTHROID) 100 MCG tablet Take 100 mcg by mouth daily.      . Multiple Vitamin (MULITIVITAMIN WITH MINERALS) TABS Take 1 tablet by mouth daily.      Marland Kitchen omeprazole (PRILOSEC) 20 MG capsule Take 20 mg by mouth as needed.       . sotalol (BETAPACE) 80 MG tablet Take 80 mg by mouth 2 (two) times daily.      . Tamsulosin HCl (FLOMAX) 0.4 MG CAPS Take 0.4 mg by mouth daily.      . pravastatin (PRAVACHOL) 20 MG tablet ON HOLD      . magnesium 30 MG tablet  Take 30 mg by mouth 2 (two) times daily.          Review of Systems  Constitutional: Positive for activity change, appetite change and unexpected weight change. Negative for fever, chills, diaphoresis and fatigue.  HENT: Positive for rhinorrhea, trouble swallowing, voice change and postnasal drip. Negative for hearing loss, ear pain, nosebleeds, congestion, sore throat, facial swelling, sneezing, mouth sores, neck pain, neck stiffness, dental problem, sinus pressure, tinnitus and ear discharge.   Eyes: Negative for photophobia, discharge, itching and visual disturbance.  Respiratory: Positive for cough, shortness of breath and wheezing. Negative for apnea, choking, chest tightness and stridor.   Cardiovascular: Positive for chest pain. Negative for palpitations and leg swelling.  Gastrointestinal: Positive for constipation. Negative for nausea, vomiting, abdominal pain, blood in stool and abdominal distention.  Genitourinary: Positive for decreased urine volume. Negative for dysuria, urgency, frequency, hematuria, flank pain and difficulty urinating.  Musculoskeletal: Positive for gait problem. Negative for myalgias, back pain, joint swelling and arthralgias.  Skin: Positive for pallor. Negative for color  change and rash.  Neurological: Positive for dizziness, weakness and light-headedness. Negative for tremors, seizures, syncope, speech difficulty, numbness and headaches.  Hematological: Negative for adenopathy. Bruises/bleeds easily.  Psychiatric/Behavioral: Negative for confusion, sleep disturbance and agitation. The patient is not nervous/anxious.        Objective:   Physical Exam  Filed Vitals:   01/29/12 1400  BP: 102/60  Pulse: 72  Temp: 97.4 F (36.3 C)  TempSrc: Oral  Height: 6' (1.829 m)  Weight: 140 lb 8 oz (63.73 kg)  SpO2: 100%    ZOX:WRUE , in no distress,  normal affect  ENT: No lesions,  mouth clear,  oropharynx clear, no postnasal drip  Neck: No JVD, no TMG, no carotid bruits  Lungs: No use of accessory muscles, no dullness to percussion,scattered rhonchi  Cardiovascular: RRR, heart sounds normal, no murmur or gallops, no peripheral edema  Abdomen: soft and NT, no HSM,  BS normal  Musculoskeletal: No deformities, no cyanosis or clubbing  Neuro: alert, non focal  Skin: Warm, no lesions or rashes  CT chest 3/13: reviewed.         Assessment & Plan:   Pneumonia Bilateral atypical pneumonia ? MAC infection ?bronchoalveolar CA Plan Needs FOB  >>>02/02/12 No med changes until Fob resulted    Updated Medication List Outpatient Encounter Prescriptions as of 01/29/2012  Medication Sig Dispense Refill  . acetaminophen-codeine (TYLENOL #3) 300-30 MG per tablet Take 1 tablet by mouth every 6 (six) hours as needed. For pain      . ALPRAZolam (XANAX) 0.5 MG tablet Take 0.5 mg by mouth at bedtime as needed. For anxiety      . aspirin 81 MG tablet Take 81 mg by mouth daily.      . carbamazepine (CARBATROL) 100 MG 12 hr capsule Take 100 mg by mouth 2 (two) times daily.      . Cholecalciferol (VITAMIN D) 2000 UNITS tablet Take by mouth daily. 2000 in am and 1000 in pm      . clopidogrel (PLAVIX) 75 MG tablet Take 75 mg by mouth daily.      Marland Kitchen dutasteride  (AVODART) 0.5 MG capsule Take 0.5 mg by mouth daily.      . fish oil-omega-3 fatty acids 1000 MG capsule Take 1,200 g by mouth daily.       . Flaxseed, Linseed, 1000 MG CAPS Take 1 capsule by mouth daily.       . folic  acid (FOLVITE) 800 MCG tablet Take 800 mcg by mouth daily.       Marland Kitchen levothyroxine (SYNTHROID, LEVOTHROID) 100 MCG tablet Take 100 mcg by mouth daily.      . Magnesium 400 MG CAPS Take 1 capsule by mouth daily.      . Multiple Vitamin (MULITIVITAMIN WITH MINERALS) TABS Take 1 tablet by mouth daily.      Marland Kitchen omeprazole (PRILOSEC) 20 MG capsule Take 20 mg by mouth as needed.       . sotalol (BETAPACE) 80 MG tablet Take 80 mg by mouth 2 (two) times daily.      . Tamsulosin HCl (FLOMAX) 0.4 MG CAPS Take 0.4 mg by mouth daily.      . pravastatin (PRAVACHOL) 20 MG tablet ON HOLD      . DISCONTD: magnesium 30 MG tablet Take 30 mg by mouth 2 (two) times daily.

## 2012-02-02 NOTE — Progress Notes (Signed)
Bronch Procedure Note:  Video Bronch done Specimen obtained during the procedure washing intervention, biopsy intervention and bal intervention.

## 2012-02-02 NOTE — Op Note (Signed)
Bronchoscopy Procedure Note  Date of Operation: 02/02/2012  Pre-op Diagnosis: bilateral chronic pneumonia  Post-op Diagnosis: same  Surgeon: Shan Levans  Anesthesia: Monitored Local Anesthesia with Sedation  Operation: Flexible fiberoptic bronchoscopy, diagnostic   Findings: Diffuse tracheobronchitis , no endobronchial lesions  Specimen: RML TBBX , BAL RML, bronch wash LLL  Estimated Blood Loss: less than 100   Complications: 75cc hemorrhage , stopped with epinephrine/ thrombin topically from RML bx  Indications and History: The patient is a 78 y.o. male with chronic pneumonia.  The risks, benefits, complications, treatment options and expected outcomes were discussed with the patient.  The possibilities of reaction to medication, pulmonary aspiration, perforation of a viscus, bleeding, failure to diagnose a condition and creating a complication requiring transfusion or operation were discussed with the patient who freely signed the consent.    Description of Procedure: The patient was re-examined in the bronchoscopy suite and the site of surgery properly noted/marked.  The patient was identified as Jerry Barber and the procedure verified as Flexible Fiberoptic Bronchoscopy.  A Time Out was held and the above information confirmed.   After the induction of topical nasopharyngeal anesthesia, the patient was positioned  and the bronchoscope was passed through the L nares. The vocal cords were visualized and  1% buffered lidocaine 5 ml was topically placed onto the cords. The cords were normal. The scope was then passed into the trachea.  1% buffered lidocaine 5 ml was used topically on the carina.  Careful inspection of the tracheal lumen was accomplished. The scope was sequentially passed into the left main and then left upper and lower bronchi and segmental bronchi.     Bronchial washing  was done and there was one  specimen.   The scope was then withdrawn and advanced into the  right main bronchus and then into the RUL, RML, and RLL bronchi and segmental bronchi.   BAL and transbronchial bx x 2   Were  done and there was two  Specimens.  The procedure was terminated when 75cc hemorrhage occurred with second Bx. This was controlled with topical thrombin and epinephrine  Endobronchial findings: Diffuse tracheobronchitis Trachea: Edematous mucosa Carina: Edematous mucosa Right main bronchus: Edematous mucosa Right middle  lobe bronchus: Collapse with mucus plugging Right upper lobe bronchus: Edematous mucosa Left main bronchus: Edematous mucosa Left upper lobe bronchus: Edematous mucosa Left lower lobe bronchus: Collapse with mucus plugging  The Patient was taken to the Endoscopy Recovery area in satisfactory condition.  Attestation: I performed the procedure.  Shan Levans

## 2012-02-02 NOTE — Discharge Instructions (Signed)
DISCHARGE INSTRUCTIONS TO PATIENT  Medications and dosages:current medication regimen unchanged.  You had some bleeding with the biopsies.  You may bleed some more.  REMINDER:   Carry a list of your medications and allergies with you at all times  Call your pharmacy at least 1 week in advance to refill prescriptions  Do not mix any prescribed pain medicine with alcohol  Do not drive any motor vehicles while taking pain medication.  Take medications with food.  Do not retake a pain medication if you vomit after taking it, unless you check with the Doctor.  Activity: Slowly increase activity.  May return to normal activity/work in AM.   Follow-up appointments (date to return to physician): Call for appointment in [place] next week  Call Surgeon if you have:  Temperature greater than 100.4  Persistent nausea and vomiting  Severe uncontrolled pain  Redness, tenderness, or signs of infection (pain, swelling, redness, odor or green/yellow discharge around the site)  Difficulty breathing, headache or visual disturbances  Hives  Persistent dizziness or light-headedness  Extreme fatigue  Any other questions or concerns you may have after discharge  In an emergency, call 911 or go to an Emergency Department at a nearby hospital    Diet:   Begin with liquids, and if they are tolerated, resume your usual diet.  Avoid spicy, greasy or heavy foods.  If you have nausea or vomiting, go back to liquids.  If you cannot keep liquids down, call your doctor.  Avoid alcohol consumption while on prescription pain medications. Good nutrition promotes healing. Increase fiber and fluids.    Education Materials Received: :yes Belongings Returned: yes   I understand and acknowledge receipt of the above instructions.                                                                                                                                        Patient or Guardian Signature                                                                     Date/Time  Physician's or R.N.'s Signature                                                                  Date/Time  The discharge instructions have been reviewed with the patient and/or Family Member/Parent/Guardian.  Patient and/or Family Member/Parent/Guardian signed and retained a printed copy.   Bronchoscopy Care After These instructions give you information on caring for yourself after your procedure. Your doctor may also give you specific instructions. Call your doctor if you have any problems or questions after your procedure. HOME CARE  Do not eat or drink anything for 2 hours after your test. Your nose and throat was numbed by medicine. If you try to eat or drink before the medicine wears off, food or drink could go into your lungs.   For the rest of the first day, eat soft food and drink liquids slowly.   On the day after the test, you can go back to eating your usual food.   Do not drive or sign important papers the day of the test.   Take it easy for the next 2 days. Do not do any heavy work, exercise, or activities.   Only take medicine as told by your doctor. Do not take aspirin.   You may be drowsy for the next 24 hours.   You may see traces of blood in your spit for 1 to 2 days.  Finding out the results of your test Ask when your test results will be ready. Make sure you get your test results. GET HELP RIGHT AWAY IF:  You have breathing problems.   You have a bad sore throat for more than 1 week.   You see traces of blood in your spit for more than 3 days.   You start coughing up blood.   You have a temperature of 102 F (38.9 C) or higher.  MAKE SURE YOU:  Understand these instructions.   Will watch your condition.   Will get help right away if you are  not doing well or get worse.  Document Released: 08/31/2009 Document Revised: 10/23/2011 Document Reviewed: 08/31/2009 [employer] Patient Information 2012 [place], [place].

## 2012-02-02 NOTE — Interval H&P Note (Signed)
I have seen and examined this patient and there are no interval changes in the history or exam. Shan Levans Beeper  [phone]  Cell  [phone]  If no response or cell goes to voicemail, call beeper [phone]

## 2012-02-03 ENCOUNTER — Encounter (HOSPITAL_COMMUNITY): Payer: Self-pay | Admitting: Critical Care Medicine

## 2012-02-04 ENCOUNTER — Telehealth: Payer: Self-pay | Admitting: Critical Care Medicine

## 2012-02-04 DIAGNOSIS — J690 Pneumonitis due to inhalation of food and vomit: Secondary | ICD-10-CM

## 2012-02-04 DIAGNOSIS — R1319 Other dysphagia: Secondary | ICD-10-CM | POA: Insufficient documentation

## 2012-02-04 MED ORDER — PREDNISONE 10 MG PO TABS: ORAL_TABLET | ORAL | Status: DC

## 2012-02-04 MED ORDER — BUDESONIDE 180 MCG/ACT IN AEPB: 1.0000 | INHALATION_SPRAY | Freq: Two times a day (BID) | RESPIRATORY_TRACT | Status: DC

## 2012-02-04 NOTE — Telephone Encounter (Signed)
I am waiting on culture results He needs OV soon to discuss results If cultures back before OV I will call him and change Rx then

## 2012-02-04 NOTE — Telephone Encounter (Signed)
Called spoke with patient, advised of culture results as stated by PEW.  Pt verbalized his understanding.  Advised patient to ask his pharmacist how to use the pulmicort as he was having a little difficulty with my explanation over the phone.  Pt okay with this recommendation.  Pt has ov with PEW on 3.28.13 @ 2:15pm and will keep this appt to discuss results and pt's questions in detail.  Advised pt to call with any questions/conerns.    Nothing further needed at this time, will sign off.

## 2012-02-04 NOTE — Telephone Encounter (Signed)
Fob shows chronic aspiration PNA with foreign body reaction d/t aspiration. Will discuss with Dr Pete Glatter Will call in pred pulse and pulmicort Pt to f/u . No pos micro data

## 2012-02-04 NOTE — Telephone Encounter (Signed)
I spoke with Jerry Barber and he stated he had a bronchoscopy done on 02/02/12 and states he is feeling good, he has not spit up any blood, and is not having any sore throat. He stated he is still experiencing SOB w/ exertion. Jerry Barber is wanting to know how long will it take him to get back to normal and what is the next step for Jerry Barber. Please advise Dr. Delford Field, thanks

## 2012-02-05 LAB — CULTURE, RESPIRATORY W GRAM STAIN: Culture: NORMAL

## 2012-02-05 LAB — CULTURE, BAL-QUANTITATIVE W GRAM STAIN: Colony Count: >=100000

## 2012-02-06 ENCOUNTER — Institutional Professional Consult (permissible substitution): Payer: Medicare Other | Admitting: Pulmonary Disease

## 2012-02-08 LAB — LEGIONELLA CULTURE

## 2012-02-12 ENCOUNTER — Encounter: Payer: Self-pay | Admitting: Critical Care Medicine

## 2012-02-12 ENCOUNTER — Ambulatory Visit (INDEPENDENT_AMBULATORY_CARE_PROVIDER_SITE_OTHER): Payer: Medicare Other | Admitting: Critical Care Medicine

## 2012-02-12 VITALS — BP 104/62 | HR 71 | Temp 97.5°F | Ht 72.0 in | Wt 139.5 lb

## 2012-02-12 DIAGNOSIS — J690 Pneumonitis due to inhalation of food and vomit: Secondary | ICD-10-CM

## 2012-02-12 DIAGNOSIS — R042 Hemoptysis: Secondary | ICD-10-CM

## 2012-02-12 MED ORDER — BUDESONIDE 180 MCG/ACT IN AEPB: 2.0000 | INHALATION_SPRAY | Freq: Two times a day (BID) | RESPIRATORY_TRACT | Status: DC

## 2012-02-12 NOTE — Assessment & Plan Note (Signed)
Due to chronic aspiration with lower airway inflammation with no evidence of malignancy

## 2012-02-12 NOTE — Patient Instructions (Signed)
Keep GI appt Finish prednisone  Increase pulmicort to two puff twice daily Return 2 months

## 2012-02-12 NOTE — Progress Notes (Signed)
Subjective:    Patient ID: Jerry Barber, male    DOB: 1925-02-27, 78 y.o.   MRN: [id number]  HPI   78 y.o. WM bilat chronic pneumonia    ?MAC Notes symptoms since early 1/13.  Still coughing but not as much. Pt notes dry hacky cough. Pt notes dyspnea as well. Notes dyspnea with any exertion.  No chest pain.  No edema in feet.  Notes some blood , not as bad as before.  02/12/2012 Pt notes is better but is still coughing.  Cough is less.  Notes less mucus.  Not as much blood. No real chest pain.  Dr Samule Ohm is GI MD.  Pt states is swallowing more slowly.   Past Medical History  Diagnosis Date  . Pacemaker   . Pneumonia   . Trigeminal neuralgia   . Hypertension   . Thyroid disease   . Dizziness   . Elevated cholesterol   . Atrial fibrillation   . BPH (benign prostatic hyperplasia)   . Anemia   . Bronchiolitis   . Kidney stones      Family History  Problem Relation Age of Onset  . Cancer Mother     colon  . Heart failure Father      History   Social History  . Marital Status: Married    Spouse Name: N/A    Number of Children: 0  . Years of Education: N/A   Occupational History  .     Social History Main Topics  . Smoking status: Never Smoker   . Smokeless tobacco: Never Used  . Alcohol Use: No  . Drug Use: No  . Sexually Active: Not on file   Other Topics Concern  . Not on file   Social History Narrative  . No narrative on file     No Known Allergies   Outpatient Prescriptions Prior to Visit  Medication Sig Dispense Refill  . acetaminophen-codeine (TYLENOL #3) 300-30 MG per tablet Take 1 tablet by mouth every 6 (six) hours as needed. For pain      . ALPRAZolam (XANAX) 0.5 MG tablet Take 0.5 mg by mouth at bedtime as needed. For anxiety      . aspirin 81 MG tablet Take 81 mg by mouth daily.      . carbamazepine (CARBATROL) 100 MG 12 hr capsule Take 100 mg by mouth 2 (two) times daily.      . Cholecalciferol (VITAMIN D) 2000 UNITS tablet  Take by mouth daily. 2000 in am and 1000 in pm      . clopidogrel (PLAVIX) 75 MG tablet Take 75 mg by mouth daily.      Marland Kitchen dutasteride (AVODART) 0.5 MG capsule Take 0.5 mg by mouth daily.      . folic acid (FOLVITE) 800 MCG tablet Take 800 mcg by mouth daily.       Marland Kitchen levothyroxine (SYNTHROID, LEVOTHROID) 100 MCG tablet Take 100 mcg by mouth daily.      . Magnesium 400 MG CAPS Take 1 capsule by mouth daily.      . Multiple Vitamin (MULITIVITAMIN WITH MINERALS) TABS Take 1 tablet by mouth daily.      Marland Kitchen omeprazole (PRILOSEC) 20 MG capsule Take 20 mg by mouth as needed.       . predniSONE (DELTASONE) 10 MG tablet Take 4 for three days 3 for three days 2 for three days 1 for three days and stop  30 tablet  0  . sotalol (BETAPACE) 80  MG tablet Take 80 mg by mouth 2 (two) times daily.      . Tamsulosin HCl (FLOMAX) 0.4 MG CAPS Take 0.4 mg by mouth daily.      . budesonide (PULMICORT FLEXHALER) 180 MCG/ACT inhaler Inhale 1 puff into the lungs 2 (two) times daily.  1 each  5  . pravastatin (PRAVACHOL) 20 MG tablet ON HOLD      . fish oil-omega-3 fatty acids 1000 MG capsule Take 1,200 g by mouth daily.       . Flaxseed, Linseed, 1000 MG CAPS Take 1 capsule by mouth daily.           Review of Systems  Constitutional: Positive for activity change, appetite change and unexpected weight change. Negative for fever, chills, diaphoresis and fatigue.  HENT: Positive for rhinorrhea, trouble swallowing, voice change and postnasal drip. Negative for hearing loss, ear pain, nosebleeds, congestion, sore throat, facial swelling, sneezing, mouth sores, neck pain, neck stiffness, dental problem, sinus pressure, tinnitus and ear discharge.   Eyes: Negative for photophobia, discharge, itching and visual disturbance.  Respiratory: Positive for cough, shortness of breath and wheezing. Negative for apnea, choking, chest tightness and stridor.   Cardiovascular: Positive for chest pain. Negative for palpitations and leg  swelling.  Gastrointestinal: Positive for constipation. Negative for nausea, vomiting, abdominal pain, blood in stool and abdominal distention.  Genitourinary: Positive for decreased urine volume. Negative for dysuria, urgency, frequency, hematuria, flank pain and difficulty urinating.  Musculoskeletal: Positive for gait problem. Negative for myalgias, back pain, joint swelling and arthralgias.  Skin: Positive for pallor. Negative for color change and rash.  Neurological: Positive for dizziness, weakness and light-headedness. Negative for tremors, seizures, syncope, speech difficulty, numbness and headaches.  Hematological: Negative for adenopathy. Bruises/bleeds easily.  Psychiatric/Behavioral: Negative for confusion, sleep disturbance and agitation. The patient is not nervous/anxious.        Objective:   Physical Exam   Filed Vitals:   02/12/12 1403  BP: 104/62  Pulse: 71  Temp: 97.5 F (36.4 C)  TempSrc: Oral  Height: 6' (1.829 m)  Weight: 139 lb 8 oz (63.277 kg)  SpO2: 99%    WUJ:WJXB , in no distress,  normal affect  ENT: No lesions,  mouth clear,  oropharynx clear, no postnasal drip  Neck: No JVD, no TMG, no carotid bruits  Lungs: No use of accessory muscles, no dullness to percussion,scattered rhonchi  Cardiovascular: RRR, heart sounds normal, no murmur or gallops, no peripheral edema  Abdomen: soft and NT, no HSM,  BS normal  Musculoskeletal: No deformities, no cyanosis or clubbing  Neuro: alert, non focal  Skin: Warm, no lesions or rashes  CT chest 3/13: reviewed.         Assessment & Plan:   Aspiration pneumonia due to food (regurgitated) Like aspiration due to esophageal dysfunction with foreign body granulomas and aspirated stomach contents on recent bronchoscopy from March 2013. Lower airway inflammation also contributing to cough and hemoptysis Plan Keep GI appt Finish prednisone  Increase pulmicort to two puff twice daily Return 2  months   Hemoptysis Due to chronic aspiration with lower airway inflammation with no evidence of malignancy     Updated Medication List Outpatient Encounter Prescriptions as of 02/12/2012  Medication Sig Dispense Refill  . acetaminophen-codeine (TYLENOL #3) 300-30 MG per tablet Take 1 tablet by mouth every 6 (six) hours as needed. For pain      . ALPRAZolam (XANAX) 0.5 MG tablet Take 0.5 mg by mouth at bedtime  as needed. For anxiety      . aspirin 81 MG tablet Take 81 mg by mouth daily.      . budesonide (PULMICORT FLEXHALER) 180 MCG/ACT inhaler Inhale 2 puffs into the lungs 2 (two) times daily.  1 each  5  . carbamazepine (CARBATROL) 100 MG 12 hr capsule Take 100 mg by mouth 2 (two) times daily.      . Cholecalciferol (VITAMIN D) 2000 UNITS tablet Take by mouth daily. 2000 in am and 1000 in pm      . clopidogrel (PLAVIX) 75 MG tablet Take 75 mg by mouth daily.      Marland Kitchen dutasteride (AVODART) 0.5 MG capsule Take 0.5 mg by mouth daily.      . folic acid (FOLVITE) 800 MCG tablet Take 800 mcg by mouth daily.       Marland Kitchen levothyroxine (SYNTHROID, LEVOTHROID) 100 MCG tablet Take 100 mcg by mouth daily.      . Magnesium 400 MG CAPS Take 1 capsule by mouth daily.      . Multiple Vitamin (MULITIVITAMIN WITH MINERALS) TABS Take 1 tablet by mouth daily.      Marland Kitchen omeprazole (PRILOSEC) 20 MG capsule Take 20 mg by mouth as needed.       . predniSONE (DELTASONE) 10 MG tablet Take 4 for three days 3 for three days 2 for three days 1 for three days and stop  30 tablet  0  . sotalol (BETAPACE) 80 MG tablet Take 80 mg by mouth 2 (two) times daily.      . Tamsulosin HCl (FLOMAX) 0.4 MG CAPS Take 0.4 mg by mouth daily.      Marland Kitchen DISCONTD: budesonide (PULMICORT FLEXHALER) 180 MCG/ACT inhaler Inhale 1 puff into the lungs 2 (two) times daily.  1 each  5  . pravastatin (PRAVACHOL) 20 MG tablet ON HOLD      . DISCONTD: fish oil-omega-3 fatty acids 1000 MG capsule Take 1,200 g by mouth daily.       Marland Kitchen DISCONTD: Flaxseed,  Linseed, 1000 MG CAPS Take 1 capsule by mouth daily.

## 2012-02-12 NOTE — Assessment & Plan Note (Signed)
Like aspiration due to esophageal dysfunction with foreign body granulomas and aspirated stomach contents on recent bronchoscopy from March 2013. Lower airway inflammation also contributing to cough and hemoptysis Plan Keep GI appt Finish prednisone  Increase pulmicort to two puff twice daily Return 2 months

## 2012-03-02 LAB — FUNGUS CULTURE W SMEAR
Fungal Smear: NONE SEEN
Special Requests: NORMAL

## 2012-03-05 ENCOUNTER — Encounter (HOSPITAL_COMMUNITY): Payer: Self-pay | Admitting: Physical Medicine and Rehabilitation

## 2012-03-05 ENCOUNTER — Emergency Department (HOSPITAL_COMMUNITY)
Admission: EM | Admit: 2012-03-05 | Discharge: 2012-03-05 | Disposition: A | Discharge status: Home / Self Care | Payer: Medicare Other | Attending: Emergency Medicine | Admitting: Emergency Medicine

## 2012-03-05 ENCOUNTER — Emergency Department (HOSPITAL_COMMUNITY): Payer: Medicare Other

## 2012-03-05 DIAGNOSIS — R5383 Other fatigue: Secondary | ICD-10-CM | POA: Insufficient documentation

## 2012-03-05 DIAGNOSIS — Z87442 Personal history of urinary calculi: Secondary | ICD-10-CM | POA: Insufficient documentation

## 2012-03-05 DIAGNOSIS — I2510 Atherosclerotic heart disease of native coronary artery without angina pectoris: Secondary | ICD-10-CM | POA: Insufficient documentation

## 2012-03-05 DIAGNOSIS — R0602 Shortness of breath: Secondary | ICD-10-CM | POA: Insufficient documentation

## 2012-03-05 DIAGNOSIS — I4891 Unspecified atrial fibrillation: Secondary | ICD-10-CM | POA: Insufficient documentation

## 2012-03-05 DIAGNOSIS — N400 Enlarged prostate without lower urinary tract symptoms: Secondary | ICD-10-CM | POA: Insufficient documentation

## 2012-03-05 DIAGNOSIS — Z951 Presence of aortocoronary bypass graft: Secondary | ICD-10-CM | POA: Insufficient documentation

## 2012-03-05 DIAGNOSIS — J984 Other disorders of lung: Secondary | ICD-10-CM | POA: Insufficient documentation

## 2012-03-05 DIAGNOSIS — R0600 Dyspnea, unspecified: Secondary | ICD-10-CM

## 2012-03-05 DIAGNOSIS — R0989 Other specified symptoms and signs involving the circulatory and respiratory systems: Secondary | ICD-10-CM | POA: Insufficient documentation

## 2012-03-05 DIAGNOSIS — Z950 Presence of cardiac pacemaker: Secondary | ICD-10-CM | POA: Insufficient documentation

## 2012-03-05 DIAGNOSIS — R0609 Other forms of dyspnea: Secondary | ICD-10-CM | POA: Insufficient documentation

## 2012-03-05 DIAGNOSIS — R5381 Other malaise: Secondary | ICD-10-CM | POA: Insufficient documentation

## 2012-03-05 DIAGNOSIS — I10 Essential (primary) hypertension: Secondary | ICD-10-CM | POA: Insufficient documentation

## 2012-03-05 LAB — BASIC METABOLIC PANEL
CO2: 27 mEq/L (ref 19–32)
Chloride: 98 mEq/L (ref 96–112)
Creatinine, Ser: 0.86 mg/dL (ref 0.50–1.35)
GFR calc Af Amer: 90 mL/min — ABNORMAL LOW (ref >90)
Sodium: 135 mEq/L (ref 135–145)

## 2012-03-05 LAB — URINALYSIS, ROUTINE W REFLEX MICROSCOPIC
Bilirubin Urine: NEGATIVE
Ketones, ur: NEGATIVE mg/dL
Urobilinogen, UA: 0.2 mg/dL (ref 0.0–1.0)

## 2012-03-05 LAB — DIFFERENTIAL
Basophils Absolute: 0 10*3/uL (ref 0.0–0.1)
Eosinophils Absolute: 0.2 10*3/uL (ref 0.0–0.7)
Eosinophils Relative: 2 % (ref 0–5)
Lymphocytes Relative: 11 % — ABNORMAL LOW (ref 12–46)
Lymphs Abs: 0.9 10*3/uL (ref 0.7–4.0)
Neutrophils Relative %: 76 % (ref 43–77)

## 2012-03-05 LAB — TROPONIN I: Troponin I: <0.3 ng/mL (ref <0.30)

## 2012-03-05 LAB — CBC
MCV: 90.4 fL (ref 78.0–100.0)
Platelets: 411 10*3/uL — ABNORMAL HIGH (ref 150–400)
RBC: 3.74 MIL/uL — ABNORMAL LOW (ref 4.22–5.81)
RDW: 13.7 % (ref 11.5–15.5)
WBC: 8.9 10*3/uL (ref 4.0–10.5)

## 2012-03-05 LAB — PRO B NATRIURETIC PEPTIDE: Pro B Natriuretic peptide (BNP): 1197 pg/mL — ABNORMAL HIGH (ref 0–450)

## 2012-03-05 NOTE — ED Notes (Signed)
Pt presents to department for evaluation of SOB. Ongoing for several weeks. Pt states severe SOB on exertion, states "I can't even walk across the room without being wore out." denies chest pain. Respirations unlabored. Speaking complete sentences. He is conscious alert and oriented x4. Able to move all extremities without difficulty. Skin warm and dry.

## 2012-03-05 NOTE — Consult Note (Addendum)
Medical Consultation in the Emergency Department  Jerry Barber:[id number] DOB: [birth year] DOA: 03/05/2012  Requesting physician: Gerhard Munch, MD Reason for consultation: Dyspnea  PCP: Ginette Otto, MD, MD  Pulmonologist: Shan Levans, MD Cardiologist: Susa Griffins, MD Gastroenterologist: Carman Ching, MD Neurologist: Avie Echevaria, M.D.  Chief Complaint: shortness of breath  HPI:  78 year old man presented to the emergency department with complaint of shortness of breath with exertion. Patient has a four-month history of worsening shortness of breath and weight loss. He has had extensive investigation up to this point by his primary care physician and his pulmonologist which has been unrevealing. He saw his cardiologist in January at which time his pacemaker was noted to be functioning correctly as 2-D echocardiogram was without significant change. As noted in the cardiologist's office note from that date the patient had no symptoms to suggest heart failure. The patient had a CAT scan of the chest and March of this year which was grossly abnormal and he saw pulmonology in consultation. Bronchoscopy was performed which was notable for significant aspiration. The patient has seen a speech therapist in the outpatient setting and has been given techniques for upper eating to reduce his risk of aspiration. He has a followup appointment with Dr. Delford Field next month and has an appointment with his gastroenterologist next week. At this point no etiology for his weight loss has been discovered but is likely the patient will require a PEG tube insertion at some point. His history is likely complicated by the fact that his taste buds cannot function correctly which he attributes to Tegretol.  Patient presents to the emergency department today with progressive shortness of breath. He notes that this is only with exertion. It has slowly worsened. No paroxysmal nocturnal dyspnea. No edema.  His breathing actually improves when he lies flat.  Workup in the emergency department was completely unremarkable but because of his dyspnea medical consultation was requested.  Chart Review:  12/04/2011: Echocardiogram: LVEF 40-45%.  02/12/2012 outpatient pulmonology visit: Aspiration pneumonia due to regurgitated food/esophageal dysfunction with foreign body granulomas and aspirate stomach contents on recent bronchoscopy March 2013. Continue prednisone and Pulmicort. Hemoptysis secondary to chronic aspiration.  Review of Systems:  No fever, changes to his vision, sore throat, rash, muscle aches, chest pain, orthopnea, PND, nausea, vomiting, abdominal pain, diarrhea, dysuria, bleeding.  Past Medical History  Diagnosis Date  . Pacemaker   . Pneumonia   . Trigeminal neuralgia   . Hypertension   . Thyroid disease   . Dizziness   . Elevated cholesterol   . Atrial fibrillation   . BPH (benign prostatic hyperplasia)   . Anemia   . Bronchiolitis   . Kidney stones    Past Surgical History  Procedure Date  . Pacemaker insertion   . Cardiac surgery   . Carotid endarterectomy     Right  . Cataract extraction   . Heart bypass 10/2006  . Cholecystectomy 1997  . Hernia repair   . Tonsillectomy   . Video bronchoscopy 02/02/2012    Procedure: VIDEO BRONCHOSCOPY WITH FLUORO;  Surgeon: Storm Frisk, MD;  Location: [facility];  Service: Cardiopulmonary;  Laterality: N/A;   Social History:  reports that he has never smoked. He has never used smokeless tobacco. He reports that he does not drink alcohol or use illicit drugs.  No Known Allergies  Family History  Problem Relation Age of Onset  . Cancer Mother     colon  . Heart failure Father  Prior to Admission medications   Medication Sig Start Date End Date Taking? Authorizing Provider  aspirin 81 MG chewable tablet Chew 81 mg by mouth daily.   Yes Historical Provider, MD  budesonide (PULMICORT) 180 MCG/ACT inhaler Inhale 2  puffs into the lungs 2 (two) times daily. 02/12/12 02/11/13 Yes Storm Frisk, MD  carbamazepine (CARBATROL) 100 MG 12 hr capsule Take 100 mg by mouth daily.    Yes Historical Provider, MD  Cholecalciferol (VITAMIN D) 2000 UNITS tablet Take 1,000-2,000 Units by mouth daily. 2000 in am and 1000 in pm   Yes Historical Provider, MD  clopidogrel (PLAVIX) 75 MG tablet Take 75 mg by mouth daily.   Yes Historical Provider, MD  dutasteride (AVODART) 0.5 MG capsule Take 0.5 mg by mouth daily.   Yes Historical Provider, MD  folic acid (FOLVITE) 800 MCG tablet Take 800 mcg by mouth daily.    Yes Historical Provider, MD  levofloxacin (LEVAQUIN) 500 MG tablet Take 500 mg by mouth daily.   Yes Historical Provider, MD  levothyroxine (SYNTHROID, LEVOTHROID) 100 MCG tablet Take 100 mcg by mouth daily.   Yes Historical Provider, MD  Magnesium 400 MG CAPS Take 1 capsule by mouth daily at 12 noon.    Yes Historical Provider, MD  Omega-3 Fatty Acids (FISH OIL) 1200 MG CAPS Take 1 capsule by mouth daily.   Yes Historical Provider, MD  OVER THE COUNTER MEDICATION Take 1 tablet by mouth 2 (two) times daily. Macular protect   Yes Historical Provider, MD  sotalol (BETAPACE) 80 MG tablet Take 80 mg by mouth 2 (two) times daily.   Yes Historical Provider, MD  Tamsulosin HCl (FLOMAX) 0.4 MG CAPS Take 0.4 mg by mouth daily.   Yes Historical Provider, MD   Physical Exam: Filed Vitals:   03/05/12 1034 03/05/12 1132 03/05/12 1302 03/05/12 1349  BP: 92/67 99/57 95/51  102/59  Pulse: 72  70   Temp: 98 F (36.7 C)     TempSrc: Axillary     Resp: 16 15 16 16   SpO2: 97% 99% 98% 98%    General:  Appears calm and comfortable. Oxygen saturation 100 percent on room air.  Eyes: Pupils: Right pupil is eccentric and minimally reactive. Left pupil round, react to light. Normal lids, irises, conjunctiva.  ENT: Grossly normal lips and tongue.  Neck: No lymphadenopathy or masses. No thyromegaly.  Cardiovascular: Regular rate and  rhythm. No murmur, rub, gallop. No large subcutaneous edema. Telemetry shows a paced rhythm.  Respiratory: Clear to auscultation bilaterally. No wheezes, rales, rhonchi. Normal respiratory effort.  Abdomen: Soft, nontender, nondistended. No masses appreciated. Loose skin noted consistent with weight loss.  Skin: No rash or induration seen.  Musculoskeletal: Grossly normal tone and strength of upper and lower extremities.  Psychiatric: Speech fluent and clear.  Labs on Admission:  Basic Metabolic Panel:  Lab 03/05/12 4098  NA 135  K 3.9  CL 98  CO2 27  GLUCOSE 123*  BUN 16  CREATININE 0.86  CALCIUM 9.4  MG --  PHOS --   CBC:  Lab 03/05/12 1209 03/05/12 1042  WBC -- 8.9  NEUTROABS 6.5 --  HGB -- 11.0*  HCT -- 33.8*  MCV -- 90.4  PLT -- 411*   Cardiac Enzymes:  Lab 03/05/12 1209  CKTOTAL --  CKMB --  CKMBINDEX --  TROPONINI <0.30   Radiological Exams on Admission: Dg Chest 2 View  03/05/2012  *RADIOLOGY REPORT*  Clinical Data: 78 year old male with shortness of breath, weakness.  CHEST - 2 VIEW  Comparison: 02/24/2012 and earlier.  Findings: Continued patchy and nodular multi focal bilateral pulmonary opacity, greater in the right lower lung.  Stable lung volumes.  No pneumothorax, pleural effusion or pulmonary edema. Stable cardiac size and mediastinal contours.  Left chest cardiac pacemaker and sequelae of CABG. No acute osseous abnormality identified.  Right upper quadrant surgical clips.  IMPRESSION: Continued multi focal pulmonary opacity not significantly improved since 01/20/2012 CT (please see that report),  and progressed compared to 12/17/2011.  Original Report Authenticated By: Harley Hallmark, M.D.    EKG: Independently reviewed. Paced rhythm.  Assessment/Plan 1. Dyspnea on exertion: Secondary to chronic lung process. No hypoxia or tachypnea at rest. No signs or symptoms to suggest acute heart failure which is consistent with his cardiologist's evaluation  from January of this year. No need for diuretic. I do note that his BNP is elevated however I do not think is clinically significant given the patient's known systolic dysfunction. Of note the patient has chronic low blood pressure which is baseline for him. 2. Chronic lung process: Patient has an outpatient appointment with his gastroenterologist next week, consideration is being given to PEG tube placement. He has followup already scheduled with Dr. Delford Field next month. 3. Chronic systolic congestive heart failure: Appears to be stable at this point without any evidence of volume overload. Continue to follow with cardiology. 4. Weight loss: Etiology unclear. He has had extensive evaluation.  The patient appears to be suffering from some wasting process as well as aspiration. However he has no objective findings warranting inpatient evaluation. Indeed he has had extensive evaluation over the last 3 months and I do not think inpatient hospitalization has anything to offer this patient at this point. I suggest no change to his medications and that he be discharged from the emergency department. I discussed my findings with the patient's primary care physician by telephone who was in agreement. I attempted to discuss this with the referring physician but he was no longer present. Covering physician (Dr. Cordelia Poche) was notified of my recommendations  Time reviewing chart and records, caring for the patient, and coordinating care with his primary care physician: 90 minutes   Brendia Sacks, MD  [facility] Pager [phone]  If 8PM-8AM, please contact floor/night-coverage [email or web address] Password [email or web address] 03/05/2012, 2:19 PM

## 2012-03-05 NOTE — ED Provider Notes (Signed)
History     CSN: [id number]  Arrival date & time 03/05/12  1022   First MD Initiated Contact with Patient 03/05/12 1121      Chief Complaint  Patient presents with  . Shortness of Breath   HPI Patient presents with concerns of dyspnea and fatigue.  He notes that over the past months, and more so in the past weeks he has become incapable of performing previously normal activities, such as walking across the room.  He denies associated chest pain, or syncope.  However, he notes that his symptoms have progressed to complete incapacitation.  Over the past months he has been evaluated by his primary care physician as well as pulmonology, for a demonstrated bilateral lung lesion.  He has been told this is nonmalignant.  He has a history of CAD, status post CABG, but no prior diagnosis of heart failure. He notes that his symptoms do not improve with OTC or prescribed medications.  And as above, symptoms are worse with exertion. Past Medical History  Diagnosis Date  . Pacemaker   . Pneumonia   . Trigeminal neuralgia   . Hypertension   . Thyroid disease   . Dizziness   . Elevated cholesterol   . Atrial fibrillation   . BPH (benign prostatic hyperplasia)   . Anemia   . Bronchiolitis   . Kidney stones     Past Surgical History  Procedure Date  . Pacemaker insertion   . Cardiac surgery   . Carotid endarterectomy     Right  . Cataract extraction   . Heart bypass 10/2006  . Cholecystectomy 1997  . Hernia repair   . Tonsillectomy   . Video bronchoscopy 02/02/2012    Procedure: VIDEO BRONCHOSCOPY WITH FLUORO;  Surgeon: Storm Frisk, MD;  Location: [facility];  Service: Cardiopulmonary;  Laterality: N/A;    Family History  Problem Relation Age of Onset  . Cancer Mother     colon  . Heart failure Father     History  Substance Use Topics  . Smoking status: Never Smoker   . Smokeless tobacco: Never Used  . Alcohol Use: No      Review of Systems  Constitutional:   Per HPI, otherwise negative  HENT:       Per HPI, otherwise negative  Eyes: Negative.   Respiratory:       Per HPI, otherwise negative  Cardiovascular:       Per HPI, otherwise negative  Gastrointestinal: Negative for vomiting.  Genitourinary: Negative.   Musculoskeletal:       Per HPI, otherwise negative  Skin: Negative.   Neurological: Negative for syncope.    Allergies  Review of patient's allergies indicates no known allergies.  Home Medications   Current Outpatient Rx  Name Route Sig Dispense Refill  . ASPIRIN 81 MG PO CHEW Oral Chew 81 mg by mouth daily.    . BUDESONIDE 180 MCG/ACT IN AEPB Inhalation Inhale 2 puffs into the lungs 2 (two) times daily.    Marland Kitchen CARBAMAZEPINE ER 100 MG PO CP12 Oral Take 100 mg by mouth daily.     Marland Kitchen VITAMIN D 2000 UNITS PO TABS Oral Take 1,000-2,000 Units by mouth daily. 2000 in am and 1000 in pm    . CLOPIDOGREL BISULFATE 75 MG PO TABS Oral Take 75 mg by mouth daily.    . DUTASTERIDE 0.5 MG PO CAPS Oral Take 0.5 mg by mouth daily.    Marland Kitchen FOLIC ACID 800 MCG PO TABS Oral  Take 800 mcg by mouth daily.     Marland Kitchen LEVOFLOXACIN 500 MG PO TABS Oral Take 500 mg by mouth daily.    Marland Kitchen LEVOTHYROXINE SODIUM 100 MCG PO TABS Oral Take 100 mcg by mouth daily.    Marland Kitchen MAGNESIUM 400 MG PO CAPS Oral Take 1 capsule by mouth daily at 12 noon.     Marland Kitchen FISH OIL 1200 MG PO CAPS Oral Take 1 capsule by mouth daily.    Marland Kitchen OVER THE COUNTER MEDICATION Oral Take 1 tablet by mouth 2 (two) times daily. Macular protect    . SOTALOL HCL 80 MG PO TABS Oral Take 80 mg by mouth 2 (two) times daily.    Marland Kitchen TAMSULOSIN HCL 0.4 MG PO CAPS Oral Take 0.4 mg by mouth daily.      BP 99/57  Pulse 72  Temp(Src) 98 F (36.7 C) (Axillary)  Resp 15  SpO2 99%  Physical Exam  Nursing note and vitals reviewed. Constitutional: He is oriented to person, place, and time. He appears well-developed. No distress.  HENT:  Head: Normocephalic and atraumatic.  Eyes: Conjunctivae and EOM are normal.    Cardiovascular: Normal rate and regular rhythm.   Pulmonary/Chest: Effort normal. No stridor. No respiratory distress.  Abdominal: He exhibits no distension.  Musculoskeletal: He exhibits no edema.  Neurological: He is alert and oriented to person, place, and time.  Skin: Skin is warm and dry.  Psychiatric: He has a normal mood and affect.    ED Course  Procedures (including critical care time)  Labs Reviewed  CBC - Abnormal; Notable for the following:    RBC 3.74 (*)    Hemoglobin 11.0 (*)    HCT 33.8 (*)    Platelets 411 (*)    All other components within normal limits  BASIC METABOLIC PANEL - Abnormal; Notable for the following:    Glucose, Bld 123 (*)    GFR calc non Af Amer 77 (*)    GFR calc Af Amer 90 (*)    All other components within normal limits  PRO B NATRIURETIC PEPTIDE - Abnormal; Notable for the following:    Pro B Natriuretic peptide (BNP) 1197.0 (*)    All other components within normal limits  DIFFERENTIAL  TROPONIN I  URINALYSIS, ROUTINE W REFLEX MICROSCOPIC   Dg Chest 2 View  03/05/2012  *RADIOLOGY REPORT*  Clinical Data: 78 year old male with shortness of breath, weakness.  CHEST - 2 VIEW  Comparison: 02/24/2012 and earlier.  Findings: Continued patchy and nodular multi focal bilateral pulmonary opacity, greater in the right lower lung.  Stable lung volumes.  No pneumothorax, pleural effusion or pulmonary edema. Stable cardiac size and mediastinal contours.  Left chest cardiac pacemaker and sequelae of CABG. No acute osseous abnormality identified.  Right upper quadrant surgical clips.  IMPRESSION: Continued multi focal pulmonary opacity not significantly improved since 01/20/2012 CT (please see that report),  and progressed compared to 12/17/2011.  Original Report Authenticated By: Harley Hallmark, M.D.     No diagnosis found.  Cardiac: 70 paced -abnormal Pulse ox 99& ra- normal   Date: 03/05/2012  Rate: 70  Rhythm: paced  QRS Axis: indeterminate   Intervals: paced  ST/T Wave abnormalities: paced  Conduction Disutrbances:paced  Narrative Interpretation:   Old EKG Reviewed: unchanged    MDM  This elderly male now presents with worsening dyspnea and inability to function.  The patient has a notable recent medical history of new lung lesion, incompletely characterized.  There is some  this lesion may be chronic pneumonia, however the patient is afebrile, with no leukocytosis and with minimal cough.  The patient also has no recent echocardiogram.  Given the patient's elevation in his BNP, his description of worsening dyspnea and inability to function, the patient will be admitted for further evaluation and management.  Patient's presentation is most likely multifactorial including components of failure to thrive, the aforementioned problems.    Gerhard Munch, MD 03/05/12 [phone]

## 2012-03-05 NOTE — ED Notes (Signed)
Patient is no longer using accessory muscles and is resting quietly; no acute respiratory distress noted at this time.

## 2012-03-05 NOTE — ED Notes (Signed)
Verbal order per Dr. Irene Limbo to discharge patient home.  Dr. Freida Busman aware (notified by Dr. Irene Limbo).

## 2012-03-09 ENCOUNTER — Other Ambulatory Visit (HOSPITAL_COMMUNITY): Payer: Self-pay | Admitting: Gastroenterology

## 2012-03-09 DIAGNOSIS — R634 Abnormal weight loss: Secondary | ICD-10-CM

## 2012-03-18 ENCOUNTER — Encounter (HOSPITAL_COMMUNITY)
Admission: RE | Admit: 2012-03-18 | Discharge: 2012-03-18 | Disposition: A | Discharge status: Home / Self Care | Payer: Medicare Other | Source: Ambulatory Visit | Attending: Gastroenterology | Admitting: Gastroenterology

## 2012-03-18 DIAGNOSIS — R634 Abnormal weight loss: Secondary | ICD-10-CM | POA: Insufficient documentation

## 2012-03-18 MED ORDER — TECHNETIUM TC 99M SULFUR COLLOID
2.0000 | Freq: Once | INTRAVENOUS | Status: AC | PRN
  Administered 2012-03-18: 2 via INTRAVENOUS

## 2012-03-22 LAB — AFB CULTURE WITH SMEAR (NOT AT ARMC): Special Requests: NORMAL

## 2012-03-23 ENCOUNTER — Other Ambulatory Visit (HOSPITAL_COMMUNITY): Payer: Medicare Other

## 2012-04-15 ENCOUNTER — Encounter: Payer: Self-pay | Admitting: Critical Care Medicine

## 2012-04-15 ENCOUNTER — Ambulatory Visit (INDEPENDENT_AMBULATORY_CARE_PROVIDER_SITE_OTHER): Payer: Medicare Other | Admitting: Critical Care Medicine

## 2012-04-15 ENCOUNTER — Ambulatory Visit (HOSPITAL_BASED_OUTPATIENT_CLINIC_OR_DEPARTMENT_OTHER)
Admission: RE | Admit: 2012-04-15 | Discharge: 2012-04-15 | Disposition: A | Discharge status: Home / Self Care | Payer: Medicare Other | Source: Ambulatory Visit | Attending: Critical Care Medicine | Admitting: Critical Care Medicine

## 2012-04-15 VITALS — BP 92/54 | HR 73 | Temp 97.7°F | Ht 72.0 in | Wt 133.0 lb

## 2012-04-15 DIAGNOSIS — R0602 Shortness of breath: Secondary | ICD-10-CM | POA: Insufficient documentation

## 2012-04-15 DIAGNOSIS — J690 Pneumonitis due to inhalation of food and vomit: Secondary | ICD-10-CM | POA: Insufficient documentation

## 2012-04-15 DIAGNOSIS — I700 Atherosclerosis of aorta: Secondary | ICD-10-CM | POA: Insufficient documentation

## 2012-04-15 DIAGNOSIS — R059 Cough, unspecified: Secondary | ICD-10-CM | POA: Insufficient documentation

## 2012-04-15 DIAGNOSIS — R05 Cough: Secondary | ICD-10-CM | POA: Insufficient documentation

## 2012-04-15 DIAGNOSIS — Z950 Presence of cardiac pacemaker: Secondary | ICD-10-CM | POA: Insufficient documentation

## 2012-04-15 MED ORDER — PREDNISONE 10 MG PO TABS: ORAL_TABLET | ORAL | Status: DC

## 2012-04-15 MED ORDER — AMOXICILLIN-POT CLAVULANATE 875-125 MG PO TABS: 1.0000 | ORAL_TABLET | Freq: Two times a day (BID) | ORAL | Status: AC

## 2012-04-15 NOTE — Patient Instructions (Signed)
Obtain CT Chest today Swallow evaluation at [facility] Augmentin one twice daily x 10 days Prednisone 10mg  Take 4 for three days 3 for three days 2 for three days 1 for three days and stop Both meds sent to pharmacy downstairs Stop pulmicort No other medication changes Return 2 month I will call with results

## 2012-04-15 NOTE — Progress Notes (Signed)
Subjective:    Patient ID: KOBYN KRAY, male    DOB: Feb 23, 1925, 78 y.o.   MRN: [id number]  HPI   78 y.o. WM bilat chronic pneumonia    ?MAC Notes symptoms since early 1/13.  Still coughing but not as much. Pt notes dry hacky cough. Pt notes dyspnea as well. Notes dyspnea with any exertion.  No chest pain.  No edema in feet.  Notes some blood , not as bad as before.  02/12/2012 Pt notes is better but is still coughing.  Cough is less.  Notes less mucus.  Not as much blood. No real chest pain.  Dr Samule Ohm is GI MD.  Pt states is swallowing more slowly.  04/15/2012 Now : Appt is better but remains weak.  Pt notes more dyspnea.  Not much cough.  No more hemoptysis. Saw [facility].   Went to ED 4/13 and sent home.  No chest pain. No dysphagia.  ? If feels like going correct way.       Past Medical History  Diagnosis Date  . Pacemaker   . Pneumonia   . Trigeminal neuralgia   . Hypertension   . Thyroid disease   . Dizziness   . Elevated cholesterol   . Atrial fibrillation   . BPH (benign prostatic hyperplasia)   . Anemia   . Bronchiolitis   . Kidney stones      Family History  Problem Relation Age of Onset  . Cancer Mother     colon  . Heart failure Father      History   Social History  . Marital Status: Married    Spouse Name: N/A    Number of Children: 0  . Years of Education: N/A   Occupational History  .     Social History Main Topics  . Smoking status: Never Smoker   . Smokeless tobacco: Never Used  . Alcohol Use: No  . Drug Use: No  . Sexually Active: Not on file   Other Topics Concern  . Not on file   Social History Narrative  . No narrative on file     No Known Allergies   Outpatient Prescriptions Prior to Visit  Medication Sig Dispense Refill  . aspirin 81 MG chewable tablet Chew 81 mg by mouth daily.      . Cholecalciferol (VITAMIN D) 2000 UNITS tablet Take 1,000-2,000 Units by mouth daily. 2000 in am and 1000 in pm        . clopidogrel (PLAVIX) 75 MG tablet Take 75 mg by mouth daily.      Marland Kitchen dutasteride (AVODART) 0.5 MG capsule Take 0.5 mg by mouth daily.      . folic acid (FOLVITE) 800 MCG tablet Take 800 mcg by mouth daily.       Marland Kitchen levothyroxine (SYNTHROID, LEVOTHROID) 100 MCG tablet Take 100 mcg by mouth daily.      . Magnesium 400 MG CAPS Take 1 capsule by mouth daily at 12 noon.       . mirtazapine (REMERON) 15 MG tablet Take 15 mg by mouth at bedtime.      . sotalol (BETAPACE) 80 MG tablet Take 80 mg by mouth 2 (two) times daily.      . Tamsulosin HCl (FLOMAX) 0.4 MG CAPS Take 0.4 mg by mouth daily.      . budesonide (PULMICORT) 180 MCG/ACT inhaler Inhale 2 puffs into the lungs 2 (two) times daily.      . carbamazepine (CARBATROL)  100 MG 12 hr capsule Take 100 mg by mouth daily.       Marland Kitchen levofloxacin (LEVAQUIN) 500 MG tablet Take 500 mg by mouth daily.      . Omega-3 Fatty Acids (FISH OIL) 1200 MG CAPS Take 1 capsule by mouth daily.      Marland Kitchen OVER THE COUNTER MEDICATION Take 1 tablet by mouth 2 (two) times daily. Macular protect          Review of Systems  Constitutional: Positive for activity change, appetite change and unexpected weight change. Negative for fever, chills, diaphoresis and fatigue.  HENT: Positive for rhinorrhea, trouble swallowing, voice change and postnasal drip. Negative for hearing loss, ear pain, nosebleeds, congestion, sore throat, facial swelling, sneezing, mouth sores, neck pain, neck stiffness, dental problem, sinus pressure, tinnitus and ear discharge.   Eyes: Negative for photophobia, discharge, itching and visual disturbance.  Respiratory: Positive for cough, shortness of breath and wheezing. Negative for apnea, choking, chest tightness and stridor.   Cardiovascular: Positive for chest pain. Negative for palpitations and leg swelling.  Gastrointestinal: Positive for constipation. Negative for nausea, vomiting, abdominal pain, blood in stool and abdominal distention.   Genitourinary: Positive for decreased urine volume. Negative for dysuria, urgency, frequency, hematuria, flank pain and difficulty urinating.  Musculoskeletal: Positive for gait problem. Negative for myalgias, back pain, joint swelling and arthralgias.  Skin: Positive for pallor. Negative for color change and rash.  Neurological: Positive for dizziness, weakness and light-headedness. Negative for tremors, seizures, syncope, speech difficulty, numbness and headaches.  Hematological: Negative for adenopathy. Bruises/bleeds easily.  Psychiatric/Behavioral: Negative for confusion, sleep disturbance and agitation. The patient is not nervous/anxious.        Objective:   Physical Exam   Filed Vitals:   04/15/12 1033  BP: 92/54  Pulse: 73  Temp: 97.7 F (36.5 C)  TempSrc: Oral  Height: 6' (1.829 m)  Weight: 133 lb (60.328 kg)  SpO2: 98%    AVW:UJWJ , in no distress,  normal affect  ENT: No lesions,  mouth clear,  oropharynx clear, no postnasal drip  Neck: No JVD, no TMG, no carotid bruits  Lungs: No use of accessory muscles, no dullness to percussion,scattered rhonchi  Cardiovascular: RRR, heart sounds normal, no murmur or gallops, no peripheral edema  Abdomen: soft and NT, no HSM,  BS normal  Musculoskeletal: No deformities, no cyanosis or clubbing  Neuro: alert, non focal  Skin: Warm, no lesions or rashes  Ct Chest Wo Contrast  04/15/2012  *RADIOLOGY REPORT*  Clinical Data: Shortness of breath and dry cough.  Evaluate for potential pneumonia.  CT CHEST WITHOUT CONTRAST  Technique:  Multidetector CT imaging of the chest was performed following the standard protocol without IV contrast.  Comparison: Multiple priors, most recently 01/20/2012.  Findings:  Mediastinum: A left-sided pacemaker device is in place with lead tips terminating in the right atrial appendage and right ventricular apex. Heart size is normal. There is atherosclerosis of the thoracic aorta, the great vessels  of the mediastinum and the coronary arteries, including calcified atherosclerotic plaque in the left main, left anterior descending, left circumflex and right coronary arteries. Postoperative changes of median sternotomy for CABG with a LIMA to the LAD are noted. There is no significant pericardial fluid, thickening or pericardial calcification. No pathologically enlarged mediastinal or hilar lymph nodes. Please note that accurate exclusion of hilar adenopathy is limited on noncontrast CT scans.  Numerous borderline enlarged mediastinal and hilar lymph nodes are noted, presumably reactive.  Esophagus is  unremarkable in appearance.   Calcifications of the aortic valve.  Lungs/Pleura: There is again a pattern of diffuse bronchial wall thickening with scattered areas of cylindrical and varicose bronchiectasis throughout the lungs bilaterally, with the most severe involvement in the right middle and right lower lobes.  In the most severely affected regions there are associated findings of thickening of the peribronchovascular interstitium with some patchy micro and macronodularity, as well as areas of more consolidative airspace disease; findings that have increased significantly compared to the prior examination and 01/20/2012.  Most notably, there is now an area of airspace consolidation in the superior segment of the left lower lobe which is new, and increasing air space consolidation throughout the right middle lobe.  No definite pleural effusions.  Upper Abdomen: Atherosclerosis.  Otherwise, unremarkable.  Musculoskeletal: Median sternotomy wires. There are no aggressive appearing lytic or blastic lesions noted in the visualized portions of the skeleton.  IMPRESSION: 1.  Appearance of the lungs is again consistent with a chronic atypical infection, with differential considerations including both tuberculous and non tuberculous mycobacterial infection. The extent of bronchiectasis, patchy air space consolidation and  patchy nodularity has significantly increased compared to the prior examination.  Clinical correlation is highly recommended. 2. Atherosclerosis, including left main and three-vessel coronary artery disease. Please note that although the presence of coronary artery calcium documents the presence of coronary artery disease, the severity of this disease and any potential stenosis cannot be assessed on this non-gated CT examination.  Assessment for potential risk factor modification, dietary therapy or pharmacologic therapy may be warranted, if clinically indicated. 3. There are calcifications of the aortic valve.  Echocardiographic correlation for evaluation of potential valvular dysfunction may be warranted if clinically indicated.  Original Report Authenticated By: Florencia Reasons, M.D.        Assessment & Plan:   Aspiration pneumonia Chronic recurrent aspiration pneumonia with foreign body granulomas and biopsy from bronchoscopy March 2013 and visualized stomach contents in lung. Current CT chest still demonstrates cylindrical bronchiectasis with mucus plugging and lower lobe airspace disease. The patient still has a wet vocal quality. Gastroenterology evaluation does not reveal any issues with gastric emptying, or esophageal dysfunction. There is no evidence of reflux aspiration. Plan Repeat swallowing evaluation and speech therapy input Augmentin for 10 days Prednisone pulse Obtain nebulized albuterol and flutter valve      Updated Medication List Outpatient Encounter Prescriptions as of 04/15/2012  Medication Sig Dispense Refill  . ALPRAZolam (XANAX) 0.5 MG tablet Take 0.5 mg by mouth at bedtime as needed. 1/2 tab daily      . aspirin 81 MG chewable tablet Chew 81 mg by mouth daily.      . Cholecalciferol (VITAMIN D) 2000 UNITS tablet Take 1,000-2,000 Units by mouth daily. 2000 in am and 1000 in pm      . clopidogrel (PLAVIX) 75 MG tablet Take 75 mg by mouth daily.      Marland Kitchen dutasteride  (AVODART) 0.5 MG capsule Take 0.5 mg by mouth daily.      . folic acid (FOLVITE) 800 MCG tablet Take 800 mcg by mouth daily.       Marland Kitchen levothyroxine (SYNTHROID, LEVOTHROID) 100 MCG tablet Take 100 mcg by mouth daily.      . Magnesium 400 MG CAPS Take 1 capsule by mouth daily at 12 noon.       . mirtazapine (REMERON) 15 MG tablet Take 15 mg by mouth at bedtime.      . Multiple Vitamins-Minerals (  MACULAR VITAMIN BENEFIT PO) Take by mouth 2 (two) times daily.      . sotalol (BETAPACE) 80 MG tablet Take 80 mg by mouth 2 (two) times daily.      . Tamsulosin HCl (FLOMAX) 0.4 MG CAPS Take 0.4 mg by mouth daily.      Marland Kitchen albuterol (PROVENTIL) (2.5 MG/3ML) 0.083% nebulizer solution Take 3 mLs (2.5 mg total) by nebulization every 6 (six) hours as needed for wheezing.  120 mL  12  . amoxicillin-clavulanate (AUGMENTIN) 875-125 MG per tablet Take 1 tablet by mouth 2 (two) times daily.  20 tablet  0  . Nebulizers (COMPRESSOR/NEBULIZER) MISC Use with albuterol in nebulizer  1 each  0  . predniSONE (DELTASONE) 10 MG tablet Take 4 for three days 3 for three days 2 for three days 1 for three days and stop  30 tablet  0  . Respiratory Therapy Supplies (FLUTTER) DEVI Use 4 times daily  1 each  0  . DISCONTD: budesonide (PULMICORT) 180 MCG/ACT inhaler Inhale 2 puffs into the lungs 2 (two) times daily.      Marland Kitchen DISCONTD: carbamazepine (CARBATROL) 100 MG 12 hr capsule Take 100 mg by mouth daily.       Marland Kitchen DISCONTD: levofloxacin (LEVAQUIN) 500 MG tablet Take 500 mg by mouth daily.      Marland Kitchen DISCONTD: Omega-3 Fatty Acids (FISH OIL) 1200 MG CAPS Take 1 capsule by mouth daily.      Marland Kitchen DISCONTD: OVER THE COUNTER MEDICATION Take 1 tablet by mouth 2 (two) times daily. Macular protect

## 2012-04-16 MED ORDER — FLUTTER DEVI: Status: DC

## 2012-04-16 MED ORDER — ALBUTEROL SULFATE (2.5 MG/3ML) 0.083% IN NEBU: 2.5000 mg | INHALATION_SOLUTION | Freq: Four times a day (QID) | RESPIRATORY_TRACT | Status: DC | PRN

## 2012-04-16 MED ORDER — COMPRESSOR/NEBULIZER MISC: Status: DC

## 2012-04-16 NOTE — Assessment & Plan Note (Signed)
Chronic recurrent aspiration pneumonia with foreign body granulomas and biopsy from bronchoscopy March 2013 and visualized stomach contents in lung. Current CT chest still demonstrates cylindrical bronchiectasis with mucus plugging and lower lobe airspace disease. The patient still has a wet vocal quality. Gastroenterology evaluation does not reveal any issues with gastric emptying, or esophageal dysfunction. There is no evidence of reflux aspiration. Plan Repeat swallowing evaluation and speech therapy input Augmentin for 10 days Prednisone pulse Obtain nebulized albuterol and flutter valve

## 2012-04-21 ENCOUNTER — Ambulatory Visit (HOSPITAL_COMMUNITY)
Admission: RE | Admit: 2012-04-21 | Discharge: 2012-04-21 | Disposition: A | Discharge status: Home / Self Care | Payer: Medicare Other | Source: Ambulatory Visit | Attending: Critical Care Medicine | Admitting: Critical Care Medicine

## 2012-04-21 DIAGNOSIS — J690 Pneumonitis due to inhalation of food and vomit: Secondary | ICD-10-CM

## 2012-04-21 DIAGNOSIS — R1310 Dysphagia, unspecified: Secondary | ICD-10-CM | POA: Insufficient documentation

## 2012-04-21 NOTE — Procedures (Addendum)
Objective Swallowing Evaluation: Modified Barium Swallowing Study  Patient Details  Name: Jerry Barber MRN: [id number] Date of Birth: 13-Jul-1925  Today's Date: 04/21/2012 Time: 1010-1136 SLP Time Calculation (min): 86 min  Past Medical History:  Past Medical History  Diagnosis Date  . Pacemaker   . Pneumonia   . Trigeminal neuralgia   . Hypertension   . Thyroid disease   . Dizziness   . Elevated cholesterol   . Atrial fibrillation   . BPH (benign prostatic hyperplasia)   . Anemia   . Bronchiolitis   . Kidney stones    Past Surgical History:  Past Surgical History  Procedure Date  . Pacemaker insertion   . Cardiac surgery   . Carotid endarterectomy     Right  . Cataract extraction   . Heart bypass 10/2006  . Cholecystectomy 1997  . Hernia repair   . Tonsillectomy   . Video bronchoscopy 02/02/2012    Procedure: VIDEO BRONCHOSCOPY WITH FLUORO;  Surgeon: Storm Frisk, MD;  Location: [facility];  Service: Cardiopulmonary;  Laterality: N/A;  . Pilonidal cyst excision    HPI:  78 yo male referred by Dr Delford Field for MBS due to pt continuing with recurrent aspiration pna with foreign body granulomas and biopsy from bronchoscopy March 2013 with stomach contents visualized in lungs.  CT of chest 04/15/12 showed findings c/w atypical infection, esophagus appeared unremarkable.  Per MD office note, pt with wet vocal quality.  MBS completed 10/2011 with mild pharyngeal dysphagia id'd and suspected primary esophageal issues.  Pt underwent gastric empyting test negataive, esophagram 10/2011 mild esophageal motility d/o- o/w normal exam.  Pt denies problems swallowing - denies choking or coughing with intake.  Pt does acknowledge sensing secretions retained in pharynx in the mornings- presumed dried overnight.  Pt consumes coffee to loosen am secretions to allow coughing and expectoration- this is much improved than Dec 2012.  Wife reports voice hoarseness is worse in the morning.   Trigeminal neuraligia treatment reported made pt lose his appetite and impacted ability to taste resulting in decr intake.  Pt reports appetitie improving since completing neuralgia medication a few weeks ago, but he currently gets full easily.  Weight loss of 40 pounds reported in the last year.  Pt denies shortness of breath with intake.  Pt reports being exposed to secondhand smoke from his dad, sister and others for nearly 30 years.        Assessment / Plan / Recommendation Clinical Impression  Clinical impression: Pt continues with moderate dysphagia  - suspect xerostomia contributing to oral difficulties.  No aspiration or penetration observed but pt continues with moderate pharyngeal residuals with ALL consistencies - solids/pudding more at vallecular space, liquids throughout WITHOUT awareness.  Various postures including chin tuck, head turn right/left did not consistently decr amount of stasis.  Cued dry swallows decr residuals, but did not fully clear with approximately 25% of liquid swallows.  SLP suspects pt's sensory deficits to pharyngeal residuals may result in aspiration AFTER the swallow - though not observed during this test.  Barium tablet lodged at vallecular space without pt awareness - advised pt to crush pills or get liquid/chewables if not contraindicated.  Pt denies overt coughing/choking with po, but spouse acknowledged he coughs some the in the morning.  Of note RSI score was 24/45 indicating high likelihood of LPR - question if pt may be silently experiencing reflux as well given pt's sensory deficits.  Also suspect probable chronic aspiration of secretions/post nasal drip  due to wet vocal quality, chronic throat clearing observed during 1 1/2 hour session today.  Extensive education completed with pt and wife to maximize pt's airway protection including need to swallow twice due to lack of sensation to stasis and recommended consumption of water with meals.  Thanks for this  referral.      Treatment Recommendation       Diet Recommendation Dysphagia 3 (Mechanical Soft);Thin liquid (water with meals)   Medication Administration: Crushed with puree (chewable, liquid) Compensations: Multiple dry swallows after each bite/sip;Follow solids with liquid (start meal with water) Postural Changes and/or Swallow Maneuvers: Upright 30-60 min after meal;Seated upright 90 degrees    Other  Recommendations Oral Care Recommendations: Oral care before and after PO   Follow Up Recommendations       Frequency and Duration        Pertinent Vitals/Pain n/a    SLP Swallow Goals     General Date of Onset: 04/21/12 HPI: 78 yo male referred by Dr Delford Field for MBS due to pt continuing with recurrent aspiration pna with foreign body granulomas and biopsy from bronchoscopy March 2013 with stomach contents visualized in lungs.  CT of chest 04/15/12 showed findings c/w atypical infection, esophagus appeared unremarkable.  Per MD office note, pt with wet vocal quality.  MBS completed 10/2011 with mild pharyngeal dysphagia id'd and suspected primary esophageal issues.  Pt underwent gastric empyting test negataive, esophagram 10/2011 mild esophageal motility d/o- o/w normal exam.  Pt denies problems swallowing - denies choking or coughing with intake.  Pt does acknowledge sensing secretions retained in pharynx in the mornings- presumed dried overnight.  Pt consumes coffee to loosen am secretions to allow coughing and expectoration- this is much improved than Dec 2012.  Wife reports voice hoarseness is worse in the morning.  Trigeminal neuraligia treatment reported made pt lose his appetite and impacted ability to taste resulting in decr intake.  Pt reports appetitie improving since completing neuralgia medication a few weeks ago, but he currently gets full easily.  Weight loss of 40 pounds reported in the last year.  Pt denies shortness of breath with intake.  Pt reports being exposed to  secondhand smoke from his dad, sister and others for nearly 30 years.  Type of Study: Modified Barium Swallowing Study Reason for Referral: Objectively evaluate swallowing function Diet Prior to this Study: Regular;Thin liquids Temperature Spikes Noted: No Respiratory Status: Room air Behavior/Cognition: Alert;Cooperative Oral Cavity - Dentition: Dentures, top;Dentures, bottom Oral Motor / Sensory Function:  (h/o trigeminal neuralgia) Self-Feeding Abilities: Able to feed self Patient Positioning: Postural control adequate for testing Baseline Vocal Quality: Hoarse;Low vocal intensity Volitional Cough: Strong Volitional Swallow: Able to elicit Anatomy: Within functional limits Pharyngeal Secretions: Not observed secondary MBS    Reason for Referral Objectively evaluate swallowing function   Oral Phase   Oral Phase:  Mildly impaired  Pharyngeal Phase Pharyngeal Phase: Impaired   Cervical Esophageal Phase Cervical Esophageal Phase: Impaired    Donavan Burnet, MS [facility] SLP [phone]

## 2012-04-27 ENCOUNTER — Telehealth: Payer: Self-pay | Admitting: Critical Care Medicine

## 2012-04-27 NOTE — Telephone Encounter (Signed)
I gave the patient results of swallow He needs an appt sooner than August 1st

## 2012-04-28 NOTE — Telephone Encounter (Signed)
Called, spoke with pt's wife.  We have scheduled pt to see Dr. Delford Field in [facility] on tomorrow, Jun 13 at 1:30.  She is aware of this.

## 2012-04-29 ENCOUNTER — Ambulatory Visit (INDEPENDENT_AMBULATORY_CARE_PROVIDER_SITE_OTHER): Payer: Medicare Other | Admitting: Critical Care Medicine

## 2012-04-29 ENCOUNTER — Encounter: Payer: Self-pay | Admitting: Critical Care Medicine

## 2012-04-29 VITALS — BP 102/56 | HR 70 | Temp 98.3°F | Ht 72.0 in | Wt 130.0 lb

## 2012-04-29 DIAGNOSIS — A310 Pulmonary mycobacterial infection: Secondary | ICD-10-CM | POA: Insufficient documentation

## 2012-04-29 DIAGNOSIS — R1319 Other dysphagia: Secondary | ICD-10-CM

## 2012-04-29 DIAGNOSIS — J690 Pneumonitis due to inhalation of food and vomit: Secondary | ICD-10-CM

## 2012-04-29 MED ORDER — RIFAMPIN 300 MG PO CAPS: ORAL_CAPSULE | ORAL | Status: DC

## 2012-04-29 MED ORDER — ETHAMBUTOL HCL 400 MG PO TABS: ORAL_TABLET | ORAL | Status: DC

## 2012-04-29 MED ORDER — AZITHROMYCIN 500 MG PO TABS: ORAL_TABLET | ORAL | Status: DC

## 2012-04-29 NOTE — Progress Notes (Signed)
Subjective:    Patient ID: Jerry Barber, male    DOB: 1925/01/17, 78 y.o.   MRN: [id number]  HPI   78 y.o. WM bilat chronic pneumonia    ?MAC Notes symptoms since early 1/13.  Still coughing but not as much. Pt notes dry hacky cough. Pt notes dyspnea as well. Notes dyspnea with any exertion.  No chest pain.  No edema in feet.  Notes some blood , not as bad as before.  02/12/2012 Pt notes is better but is still coughing.  Cough is less.  Notes less mucus.  Not as much blood. No real chest pain.  Dr Samule Ohm is GI MD.  Pt states is swallowing more slowly.  04/15/2012 Now : Appt is better but remains weak.  Pt notes more dyspnea.  Not much cough.  No more hemoptysis. Saw [facility].   Went to ED 4/13 and sent home.  No chest pain. No dysphagia.  ? If feels like going correct way.     04/29/2012 Pt is still coughing and is dyspneic.  Cough is less.  Pt is still dyspneic with any exertion. Swallow eval: no observed aspiration but clear pooling in throat and lack of perception of swallow, no gag reflex Pt denies any significant sore throat, nasal congestion or excess secretions, fever, chills, sweats, unintended weight loss, pleurtic or exertional chest pain, orthopnea PND, or leg swelling Pt denies any increase in rescue therapy over baseline, denies waking up needing it or having any early am or nocturnal exacerbations of coughing/wheezing/or dyspnea. Pt also denies any obvious fluctuation in symptoms with  weather or environmental change or other alleviating or aggravating factors   Past Medical History  Diagnosis Date  . Pacemaker   . Pneumonia   . Trigeminal neuralgia   . Hypertension   . Thyroid disease   . Dizziness   . Elevated cholesterol   . Atrial fibrillation   . BPH (benign prostatic hyperplasia)   . Anemia   . Bronchiolitis   . Kidney stones      Family History  Problem Relation Age of Onset  . Cancer Mother     colon  . Heart failure Father       History   Social History  . Marital Status: Married    Spouse Name: N/A    Number of Children: 0  . Years of Education: N/A   Occupational History  .     Social History Main Topics  . Smoking status: Never Smoker   . Smokeless tobacco: Never Used  . Alcohol Use: No  . Drug Use: No  . Sexually Active: Not on file   Other Topics Concern  . Not on file   Social History Narrative  . No narrative on file     No Known Allergies   Outpatient Prescriptions Prior to Visit  Medication Sig Dispense Refill  . aspirin 81 MG chewable tablet Chew 81 mg by mouth daily.      . Cholecalciferol (VITAMIN D) 2000 UNITS tablet Take 1,000-2,000 Units by mouth daily. 2000 in am and 1000 in pm      . clopidogrel (PLAVIX) 75 MG tablet Take 75 mg by mouth daily.      Marland Kitchen dutasteride (AVODART) 0.5 MG capsule Take 0.5 mg by mouth daily.      . folic acid (FOLVITE) 800 MCG tablet Take 800 mcg by mouth daily.       Marland Kitchen levothyroxine (SYNTHROID, LEVOTHROID) 100 MCG tablet Take  100 mcg by mouth daily.      . Magnesium 400 MG CAPS Take 1 capsule by mouth daily at 12 noon.       . mirtazapine (REMERON) 15 MG tablet Take 15 mg by mouth at bedtime.      . Multiple Vitamins-Minerals (MACULAR VITAMIN BENEFIT PO) Take by mouth 2 (two) times daily.      . Nebulizers (COMPRESSOR/NEBULIZER) MISC Use with albuterol in nebulizer  1 each  0  . Respiratory Therapy Supplies (FLUTTER) DEVI Use 4 times daily  1 each  0  . sotalol (BETAPACE) 80 MG tablet Take 80 mg by mouth 2 (two) times daily.      . Tamsulosin HCl (FLOMAX) 0.4 MG CAPS Take 0.4 mg by mouth daily.      Marland Kitchen albuterol (PROVENTIL) (2.5 MG/3ML) 0.083% nebulizer solution Take 3 mLs (2.5 mg total) by nebulization every 6 (six) hours as needed for wheezing.  120 mL  12  . ALPRAZolam (XANAX) 0.5 MG tablet Take 0.5 mg by mouth at bedtime as needed. 1/2 tab daily      . predniSONE (DELTASONE) 10 MG tablet Take 4 for three days 3 for three days 2 for three days 1 for  three days and stop  30 tablet  0      Review of Systems  Constitutional: Positive for activity change, appetite change and unexpected weight change. Negative for fever, chills, diaphoresis and fatigue.  HENT: Positive for rhinorrhea, trouble swallowing, voice change and postnasal drip. Negative for hearing loss, ear pain, nosebleeds, congestion, sore throat, facial swelling, sneezing, mouth sores, neck pain, neck stiffness, dental problem, sinus pressure, tinnitus and ear discharge.   Eyes: Negative for photophobia, discharge, itching and visual disturbance.  Respiratory: Positive for cough, shortness of breath and wheezing. Negative for apnea, choking, chest tightness and stridor.   Cardiovascular: Positive for chest pain. Negative for palpitations and leg swelling.  Gastrointestinal: Positive for constipation. Negative for nausea, vomiting, abdominal pain, blood in stool and abdominal distention.  Genitourinary: Positive for decreased urine volume. Negative for dysuria, urgency, frequency, hematuria, flank pain and difficulty urinating.  Musculoskeletal: Positive for gait problem. Negative for myalgias, back pain, joint swelling and arthralgias.  Skin: Positive for pallor. Negative for color change and rash.  Neurological: Positive for dizziness, weakness and light-headedness. Negative for tremors, seizures, syncope, speech difficulty, numbness and headaches.  Hematological: Negative for adenopathy. Bruises/bleeds easily.  Psychiatric/Behavioral: Negative for confusion, disturbed wake/sleep cycle and agitation. The patient is not nervous/anxious.        Objective:   Physical Exam   Filed Vitals:   04/29/12 1335  BP: 102/56  Pulse: 70  Temp: 98.3 F (36.8 C)  TempSrc: Oral  Height: 6' (1.829 m)  Weight: 130 lb (58.968 kg)  SpO2: 94%    ZOX:WRUE , in no distress,  normal affect  ENT: No lesions,  mouth clear,  oropharynx clear, no postnasal drip  Neck: No JVD, no TMG, no  carotid bruits  Lungs: No use of accessory muscles, no dullness to percussion,scattered rhonchi  Cardiovascular: RRR, heart sounds normal, no murmur or gallops, no peripheral edema  Abdomen: soft and NT, no HSM,  BS normal  Musculoskeletal: No deformities, no cyanosis or clubbing  Neuro: alert, non focal  Skin: Warm, no lesions or rashes        Assessment & Plan:   MAI (mycobacterium avium-intracellulare) MAI with evidence for bronchiectasis and micro nodular disease and lower lobe infiltrates.  Aspiration playing a role  but also now concerned this represents active Mycobacterium intracellulare infection CT Chest 03/2012 c/w MAI with bronchiectasis FOB BAL Positive MAI  02/02/12 Plan Start trial of triple ABX thrice weekly RX: Ethambutal 1200mg , azithromycin 500mg  and rifabutin 600mg   3x weekly  X 12months as pt tolerates     Aspiration pneumonia Chronic aspiration pneumonia with MAI infection and bronchiectasis See recent swallow eval report Plan See MAI assessment Cont BD nebs with albuterol ?neurology referral     Updated Medication List Outpatient Encounter Prescriptions as of 04/29/2012  Medication Sig Dispense Refill  . albuterol (PROVENTIL) (2.5 MG/3ML) 0.083% nebulizer solution Take 2.5 mg by nebulization 4 (four) times daily.      Marland Kitchen aspirin 81 MG chewable tablet Chew 81 mg by mouth daily.      . Cholecalciferol (VITAMIN D) 2000 UNITS tablet Take 1,000-2,000 Units by mouth daily. 2000 in am and 1000 in pm      . clopidogrel (PLAVIX) 75 MG tablet Take 75 mg by mouth daily.      Marland Kitchen dutasteride (AVODART) 0.5 MG capsule Take 0.5 mg by mouth daily.      . folic acid (FOLVITE) 800 MCG tablet Take 800 mcg by mouth daily.       Marland Kitchen levothyroxine (SYNTHROID, LEVOTHROID) 100 MCG tablet Take 100 mcg by mouth daily.      . Magnesium 400 MG CAPS Take 1 capsule by mouth daily at 12 noon.       . mirtazapine (REMERON) 15 MG tablet Take 15 mg by mouth at bedtime.      . Multiple  Vitamins-Minerals (MACULAR VITAMIN BENEFIT PO) Take by mouth 2 (two) times daily.      . Nebulizers (COMPRESSOR/NEBULIZER) MISC Use with albuterol in nebulizer  1 each  0  . Respiratory Therapy Supplies (FLUTTER) DEVI Use 4 times daily  1 each  0  . sotalol (BETAPACE) 80 MG tablet Take 80 mg by mouth 2 (two) times daily.      . Tamsulosin HCl (FLOMAX) 0.4 MG CAPS Take 0.4 mg by mouth daily.      Marland Kitchen DISCONTD: albuterol (PROVENTIL) (2.5 MG/3ML) 0.083% nebulizer solution Take 3 mLs (2.5 mg total) by nebulization every 6 (six) hours as needed for wheezing.  120 mL  12  . ALPRAZolam (XANAX) 0.5 MG tablet Take 0.5 mg by mouth at bedtime as needed. 1/2 tab daily      . azithromycin (ZITHROMAX) 500 MG tablet One tablet three times weekly  30 tablet  11  . ethambutol (MYAMBUTOL) 400 MG tablet Three tablets three times weekly  50 tablet  11  . rifampin (RIFADIN) 300 MG capsule Two capsules three times weekly  30 capsule  11  . DISCONTD: predniSONE (DELTASONE) 10 MG tablet Take 4 for three days 3 for three days 2 for three days 1 for three days and stop  30 tablet  0

## 2012-04-29 NOTE — Assessment & Plan Note (Signed)
MAI with evidence for bronchiectasis and micro nodular disease and lower lobe infiltrates.  Aspiration playing a role but also now concerned this represents active Mycobacterium intracellulare infection CT Chest 03/2012 c/w MAI with bronchiectasis FOB BAL Positive MAI  02/02/12 Plan Start trial of triple ABX thrice weekly RX: Ethambutal 1200mg , azithromycin 500mg  and rifabutin 600mg   3x weekly  X 12months as pt tolerates

## 2012-04-29 NOTE — Patient Instructions (Signed)
Ethambutal 1200mg  three times weekly Azithromycin 500mg  three times weekly Rifampin 600mg  three times weekly No other changes Return one month

## 2012-04-29 NOTE — Assessment & Plan Note (Signed)
Chronic aspiration pneumonia with MAI infection and bronchiectasis See recent swallow eval report Plan See MAI assessment Cont BD nebs with albuterol ?neurology referral

## 2012-05-03 ENCOUNTER — Telehealth: Payer: Self-pay | Admitting: Critical Care Medicine

## 2012-05-03 DIAGNOSIS — A310 Pulmonary mycobacterial infection: Secondary | ICD-10-CM

## 2012-05-03 MED ORDER — AZITHROMYCIN 500 MG PO TABS: ORAL_TABLET | ORAL | Status: DC

## 2012-05-03 MED ORDER — RIFAMPIN 300 MG PO CAPS: ORAL_CAPSULE | ORAL | Status: DC

## 2012-05-03 MED ORDER — ETHAMBUTOL HCL 400 MG PO TABS: ORAL_TABLET | ORAL | Status: DC

## 2012-05-03 NOTE — Telephone Encounter (Signed)
Spoke with pt . Requesting triple [facility] be written [facility].advise pt that Dr Delford Field is out of the office until wed. Dr Delford Field is this ok to reprint rx' for you to sign. Please advise  Thank you

## 2012-05-03 NOTE — Telephone Encounter (Signed)
Rxs printed and placed in PW lookat to be signed.

## 2012-05-03 NOTE — Telephone Encounter (Signed)
Ok to print and I will sign Wed unless you can get someone else to do so sooner

## 2012-05-06 NOTE — Telephone Encounter (Signed)
Rxs signed by Dr. Delford Field.    Called, spoke with pt.  I have verified his home address and placed rxs in the mail.  Pt is aware and voiced no further questions/concerns at this time.

## 2012-05-07 ENCOUNTER — Telehealth: Payer: Self-pay | Admitting: Critical Care Medicine

## 2012-05-07 ENCOUNTER — Other Ambulatory Visit (HOSPITAL_COMMUNITY): Payer: Self-pay | Admitting: Geriatric Medicine

## 2012-05-07 DIAGNOSIS — T17908A Unspecified foreign body in respiratory tract, part unspecified causing other injury, initial encounter: Secondary | ICD-10-CM

## 2012-05-07 NOTE — Telephone Encounter (Signed)
noted 

## 2012-05-07 NOTE — Telephone Encounter (Signed)
Dr. Pete Glatter wanted to make Dr. Delford Field aware that he is going to set up peg for the pt. FYI to PW.

## 2012-05-10 ENCOUNTER — Telehealth: Payer: Self-pay | Admitting: Critical Care Medicine

## 2012-05-10 ENCOUNTER — Other Ambulatory Visit: Payer: Self-pay | Admitting: Radiology

## 2012-05-10 NOTE — Telephone Encounter (Signed)
Crystal  pls remind me re this on my return which will not be until 05/17/12

## 2012-05-10 NOTE — Telephone Encounter (Signed)
I spoke with pt and he states he called this AM to cancel his procedure for his feeding tube placement that was scheduled for wed. He wanted to make PW aware of this. He states he is not going to reschedule this until he speaks with PW again regarding this. Pt aware he is currently out of the office and will return July 1. Pt states he would like a call when he returns. Please advise Dr. Delford Field thanks

## 2012-05-12 ENCOUNTER — Telehealth (HOSPITAL_COMMUNITY): Payer: Self-pay | Admitting: Interventional Radiology

## 2012-05-12 ENCOUNTER — Ambulatory Visit (HOSPITAL_COMMUNITY): Admission: RE | Admit: 2012-05-12 | Payer: Medicare Other | Source: Ambulatory Visit

## 2012-05-17 NOTE — Telephone Encounter (Signed)
Will forward msg to Dr. Delford Field to address.

## 2012-05-18 NOTE — Telephone Encounter (Signed)
Spouse returned call. Wants to speak to nurse or dr Delford Field again re: pt's meds and also let dr Delford Field be aware that pt has lost 10 more lbs. since he last saw dr. Delford Field.

## 2012-05-19 ENCOUNTER — Telehealth: Payer: Self-pay | Admitting: Critical Care Medicine

## 2012-05-19 NOTE — Telephone Encounter (Signed)
I spoke with spouse and she is concerned about 3 medications pt is currently taking: azithromycin, ethambutol, and rifampin. She states they have caused him to have no appetite and he is losing weight. She is wanting to know if these can be stopped. She would also like to speak with Dr. Delford Field personally if possible. Please advise Dr. Delford Field thanks

## 2012-05-19 NOTE — Telephone Encounter (Signed)
lmomtcb x1 for spouse 

## 2012-05-19 NOTE — Telephone Encounter (Signed)
[name] will answer the phone when I call 3x.

## 2012-05-19 NOTE — Telephone Encounter (Signed)
i have tried for three days to call the pt , no answer when I call  If you get them, tell him it is ok to stop the meds I do recommend he gets the PEG tube placed for nutrition support.

## 2012-05-21 ENCOUNTER — Telehealth: Payer: Self-pay | Admitting: Critical Care Medicine

## 2012-05-21 NOTE — Telephone Encounter (Signed)
I spoke with Pattricia Boss and she is from [facility]. She states wife came in and told them PW okay for pt to stop 3 medications. I advised Pattricia Boss the 3 medications from 05/19/12 phone note. Nothing further was needed

## 2012-05-21 NOTE — Telephone Encounter (Signed)
Jerry Barber w/ [facility] returned triage's call.  Jerry Barber stated that the wife told them PW wanted some orders discontinued.  Unsure which orders this includes.  Antionette Fairy

## 2012-05-21 NOTE — Telephone Encounter (Signed)
Called, spoke with pt's spouse.  I informed her of below per Dr. Delford Field.  She verbalized understanding of this and voiced no further questions/concerns at this time.

## 2012-05-21 NOTE — Telephone Encounter (Signed)
LMOMCTB x1 at the # listed above--what "updated" order is needed?

## 2012-05-21 NOTE — Telephone Encounter (Signed)
Called and spoke with pts wife and she stated that they have talked about the feeding tube and they are ready to go ahead and have the feeding tube placed.  The wife stated that they pt will see Dr. Pete Glatter either Sunday or Monday and they both would like PW to talk with Dr. Pete Glatter to discuss the placement of the feeding tube for the pt.  Pts wife is aware that this message will be forwarded to PW.  Nothing further needed.

## 2012-05-24 ENCOUNTER — Other Ambulatory Visit (HOSPITAL_COMMUNITY): Payer: Self-pay | Admitting: Geriatric Medicine

## 2012-05-24 DIAGNOSIS — I10 Essential (primary) hypertension: Secondary | ICD-10-CM

## 2012-05-24 DIAGNOSIS — I4891 Unspecified atrial fibrillation: Secondary | ICD-10-CM

## 2012-05-24 DIAGNOSIS — E785 Hyperlipidemia, unspecified: Secondary | ICD-10-CM

## 2012-05-24 DIAGNOSIS — N400 Enlarged prostate without lower urinary tract symptoms: Secondary | ICD-10-CM

## 2012-05-24 DIAGNOSIS — E46 Unspecified protein-calorie malnutrition: Secondary | ICD-10-CM

## 2012-05-24 DIAGNOSIS — G500 Trigeminal neuralgia: Secondary | ICD-10-CM

## 2012-05-24 DIAGNOSIS — R634 Abnormal weight loss: Secondary | ICD-10-CM

## 2012-05-24 NOTE — Telephone Encounter (Signed)
Pt's wife returned call.  Advised of PEW's recs in that Dr Pete Glatter will be handling the PEG tube placement and all other aspects surrounding the pt's feeding tube.  Pt's wife okay with this and verbalized her understanding.  She did report that Dr Pete Glatter visited with patient x48mins on yesterday and stated that he will call them with an appt for the PEG tube placement.  Pt has f/u appt with PEW on 7.12.13 - for now pt will keep this appt but wife stated that she will call if he is unable to keep this.  Asked spouse that if he is not able to make this appt, to please call us to let us know > pt also has ov with PEW 8.1.13 in the [facility] office.  Encouraged pt's wife to call if anything further is needed.  Nothing further needed at this time.  Will sign off.

## 2012-05-24 NOTE — Telephone Encounter (Signed)
This issue is for Dr Pete Glatter.  I support his getting PEG

## 2012-05-24 NOTE — Telephone Encounter (Signed)
LM for spouse tcb

## 2012-05-25 ENCOUNTER — Other Ambulatory Visit: Payer: Self-pay | Admitting: Radiology

## 2012-05-26 ENCOUNTER — Ambulatory Visit (HOSPITAL_COMMUNITY): Payer: Medicare Other

## 2012-05-28 ENCOUNTER — Ambulatory Visit: Payer: Medicare Other | Admitting: Critical Care Medicine

## 2012-05-28 ENCOUNTER — Encounter (HOSPITAL_COMMUNITY): Payer: Self-pay | Admitting: Pharmacy Technician

## 2012-05-31 ENCOUNTER — Ambulatory Visit (HOSPITAL_COMMUNITY)
Admission: RE | Admit: 2012-05-31 | Discharge: 2012-05-31 | Disposition: A | Discharge status: Home / Self Care | Payer: Medicare Other | Source: Ambulatory Visit | Attending: Geriatric Medicine | Admitting: Geriatric Medicine

## 2012-05-31 ENCOUNTER — Encounter (HOSPITAL_COMMUNITY): Payer: Self-pay

## 2012-05-31 DIAGNOSIS — I4891 Unspecified atrial fibrillation: Secondary | ICD-10-CM

## 2012-05-31 DIAGNOSIS — R634 Abnormal weight loss: Secondary | ICD-10-CM | POA: Insufficient documentation

## 2012-05-31 DIAGNOSIS — E785 Hyperlipidemia, unspecified: Secondary | ICD-10-CM

## 2012-05-31 DIAGNOSIS — G500 Trigeminal neuralgia: Secondary | ICD-10-CM

## 2012-05-31 DIAGNOSIS — E46 Unspecified protein-calorie malnutrition: Secondary | ICD-10-CM | POA: Insufficient documentation

## 2012-05-31 DIAGNOSIS — I10 Essential (primary) hypertension: Secondary | ICD-10-CM

## 2012-05-31 DIAGNOSIS — N400 Enlarged prostate without lower urinary tract symptoms: Secondary | ICD-10-CM

## 2012-05-31 LAB — BASIC METABOLIC PANEL
CO2: 26 mEq/L (ref 19–32)
Calcium: 9.1 mg/dL (ref 8.4–10.5)
Creatinine, Ser: 0.7 mg/dL (ref 0.50–1.35)
GFR calc non Af Amer: 84 mL/min — ABNORMAL LOW (ref >90)
Glucose, Bld: 93 mg/dL (ref 70–99)
Sodium: 137 mEq/L (ref 135–145)

## 2012-05-31 LAB — PROTIME-INR: Prothrombin Time: 15 seconds (ref 11.6–15.2)

## 2012-05-31 LAB — CBC
MCH: 29 pg (ref 26.0–34.0)
MCHC: 33.3 g/dL (ref 30.0–36.0)
MCV: 87.1 fL (ref 78.0–100.0)
Platelets: 349 10*3/uL (ref 150–400)
RBC: 3.89 MIL/uL — ABNORMAL LOW (ref 4.22–5.81)
RDW: 16.5 % — ABNORMAL HIGH (ref 11.5–15.5)

## 2012-05-31 MED ORDER — MIDAZOLAM HCL 5 MG/5ML IJ SOLN
INTRAMUSCULAR | Status: AC | PRN
  Administered 2012-05-31: 1 mg via INTRAVENOUS
  Administered 2012-05-31 (×2): 0.5 mg via INTRAVENOUS

## 2012-05-31 MED ORDER — CEFAZOLIN SODIUM 1-5 GM-% IV SOLN
1.0000 g | Freq: Once | INTRAVENOUS | Status: AC
  Administered 2012-05-31: 1 g via INTRAVENOUS

## 2012-05-31 MED ORDER — FENTANYL CITRATE 0.05 MG/ML IJ SOLN
INTRAMUSCULAR | Status: AC | PRN
  Administered 2012-05-31: 25 ug via INTRAVENOUS

## 2012-05-31 MED ORDER — MIDAZOLAM HCL 2 MG/2ML IJ SOLN
INTRAMUSCULAR | Status: AC
  Filled 2012-05-31: qty 4

## 2012-05-31 MED ORDER — CEFAZOLIN SODIUM 1-5 GM-% IV SOLN
INTRAVENOUS | Status: AC
  Administered 2012-05-31: 1 g via INTRAVENOUS
  Filled 2012-05-31: qty 50

## 2012-05-31 MED ORDER — FENTANYL CITRATE 0.05 MG/ML IJ SOLN
INTRAMUSCULAR | Status: AC
  Filled 2012-05-31: qty 4

## 2012-05-31 MED ORDER — HYDROCODONE-ACETAMINOPHEN 5-325 MG PO TABS: 1.0000 | ORAL_TABLET | ORAL | Status: DC | PRN

## 2012-05-31 MED ORDER — SODIUM CHLORIDE 0.9 % IV SOLN: Freq: Once | INTRAVENOUS | Status: DC

## 2012-05-31 MED ORDER — IOHEXOL 300 MG/ML  SOLN
50.0000 mL | Freq: Once | INTRAMUSCULAR | Status: AC | PRN
  Administered 2012-05-31: 20 mL

## 2012-05-31 NOTE — Procedures (Signed)
Successful fluoro 83fr gtube No comp Stable Full use tomorrow

## 2012-05-31 NOTE — H&P (Signed)
Jerry Barber is an 78 y.o. male.   Chief Complaint: Malnutrition; wt loss; aspiration syndrome Scheduled for percutaneous gastric tube placement HPI: CAD/MI; CABG; hyperlipidemia; osteoarthritis; afib - off Plavix x 5 days  Past Medical History  Diagnosis Date  . Pacemaker   . Pneumonia   . Trigeminal neuralgia   . Hypertension   . Thyroid disease   . Dizziness   . Elevated cholesterol   . Atrial fibrillation   . BPH (benign prostatic hyperplasia)   . Anemia   . Bronchiolitis   . Kidney stones     Past Surgical History  Procedure Date  . Pacemaker insertion   . Cardiac surgery   . Carotid endarterectomy     Right  . Cataract extraction   . Heart bypass 10/2006  . Cholecystectomy 1997  . Hernia repair   . Tonsillectomy   . Video bronchoscopy 02/02/2012    Procedure: VIDEO BRONCHOSCOPY WITH FLUORO;  Surgeon: Storm Frisk, MD;  Location: [facility];  Service: Cardiopulmonary;  Laterality: N/A;  . Pilonidal cyst excision     Family History  Problem Relation Age of Onset  . Cancer Mother     colon  . Heart failure Father    Social History:  reports that he has never smoked. He has never used smokeless tobacco. He reports that he does not drink alcohol or use illicit drugs.  Allergies: No Known Allergies   (Not in a hospital admission)  No results found for this or any previous visit (from the past 48 hour(s)). No results found.  Review of Systems  Constitutional: Positive for weight loss. Negative for fever.  Respiratory: Positive for shortness of breath.   Cardiovascular: Negative for chest pain.  Gastrointestinal: Negative for nausea and vomiting.  Musculoskeletal: Positive for back pain.  Neurological: Positive for weakness. Negative for headaches.    Blood pressure 126/73, pulse 77, temperature 96.8 F (36 C), temperature source Oral, resp. rate 18, height 6' (1.829 m), weight 126 lb (57.153 kg), SpO2 97.00%. Physical Exam  Constitutional: He  is oriented to person, place, and time. He appears well-developed.  Cardiovascular: Normal rate, regular rhythm and normal heart sounds.   No murmur heard. Respiratory: Effort normal. He has wheezes.       Few throughout  GI: Soft. He exhibits no distension. There is no tenderness.  Musculoskeletal: Normal range of motion.       Weakness; uses walker  Neurological: He is alert and oriented to person, place, and time.  Skin: Skin is warm and dry.  Psychiatric: He has a normal mood and affect. His behavior is normal. Judgment and thought content normal.     Assessment/Plan Aspiration syndrome Wt loss - malnutrition Scheduled for perc G tube in IR Off Plavix x 5 days (a fib) Pt and wife aware of procedure benefits and risks and agreeable to proceed. Consent signed.  Alfie Alderfer A 05/31/2012, 8:26 AM

## 2012-05-31 NOTE — Progress Notes (Signed)
Abdominal binder on per order; G-Tube capped per order; approx 30-40 ml tan secretions in cannister; no c/o

## 2012-05-31 NOTE — ED Notes (Signed)
G tube to low wall suction per MD instructions

## 2012-06-17 ENCOUNTER — Encounter: Payer: Self-pay | Admitting: Critical Care Medicine

## 2012-06-17 ENCOUNTER — Telehealth: Payer: Self-pay | Admitting: *Deleted

## 2012-06-17 ENCOUNTER — Ambulatory Visit (INDEPENDENT_AMBULATORY_CARE_PROVIDER_SITE_OTHER): Payer: Medicare Other | Admitting: Critical Care Medicine

## 2012-06-17 VITALS — BP 102/58 | HR 70 | Temp 97.8°F | Ht 72.0 in | Wt 128.0 lb

## 2012-06-17 DIAGNOSIS — A310 Pulmonary mycobacterial infection: Secondary | ICD-10-CM

## 2012-06-17 NOTE — Progress Notes (Signed)
Subjective:    Patient ID: Jerry Barber, male    DOB: [birth year], 78 y.o.   MRN: [id number]  HPI   79 y.o. WM bilat chronic pneumonia    ?MAC Notes symptoms since early 1/13.  Still coughing but not as much. Pt notes dry hacky cough. Pt notes dyspnea as well. Notes dyspnea with any exertion.  No chest pain.  No edema in feet.  Notes some blood , not as bad as before.  02/12/2012 Pt notes is better but is still coughing.  Cough is less.  Notes less mucus.  Not as much blood. No real chest pain.  Dr Samule Ohm is GI MD.  Pt states is swallowing more slowly.  04/15/2012 Now : Appt is better but remains weak.  Pt notes more dyspnea.  Not much cough.  No more hemoptysis. Saw [facility].   Went to ED 4/13 and sent home.  No chest pain. No dysphagia.  ? If feels like going correct way.     04/29/2012 Pt is still coughing and is dyspneic.  Cough is less.  Pt is still dyspneic with any exertion. Swallow eval: no observed aspiration but clear pooling in throat and lack of perception of swallow, no gag reflex Pt denies any significant sore throat, nasal congestion or excess secretions, fever, chills, sweats, unintended weight loss, pleurtic or exertional chest pain, orthopnea PND, or leg swelling Pt denies any increase in rescue therapy over baseline, denies waking up needing it or having any early am or nocturnal exacerbations of coughing/wheezing/or dyspnea. Pt also denies any obvious fluctuation in symptoms with  weather or environmental change or other alleviating or aggravating factors  06/17/2012 Pt notes more dyspnea.  Cough is not worse.  Pt still coughing some blood and grey mucus. [facility] , skilled care area.   No real chest pain.  Now PEG.  Takes jevity 1.5   5  cans .  Past Medical History  Diagnosis Date  . Pacemaker   . Pneumonia   . Trigeminal neuralgia   . Hypertension   . Thyroid disease   . Dizziness   . Elevated cholesterol   . Atrial fibrillation    . BPH (benign prostatic hyperplasia)   . Anemia   . Bronchiolitis   . Kidney stones      Family History  Problem Relation Age of Onset  . Cancer Mother     colon  . Heart failure Father      History   Social History  . Marital Status: Married    Spouse Name: N/A    Number of Children: 0  . Years of Education: N/A   Occupational History  .     Social History Main Topics  . Smoking status: Never Smoker   . Smokeless tobacco: Never Used  . Alcohol Use: No  . Drug Use: No  . Sexually Active: Not on file   Other Topics Concern  . Not on file   Social History Narrative  . No narrative on file     No Known Allergies   Outpatient Prescriptions Prior to Visit  Medication Sig Dispense Refill  . albuterol (PROVENTIL) (2.5 MG/3ML) 0.083% nebulizer solution Take 2.5 mg by nebulization 3 (three) times daily.      Marland Kitchen ALPRAZolam (XANAX) 1 MG tablet Take 0.5-1 mg by mouth at bedtime as needed. For sleep.      Marland Kitchen aspirin EC 81 MG tablet Take 81 mg by mouth at bedtime.      Marland Kitchen  azithromycin (ZITHROMAX) 250 MG tablet Take 250 mg by mouth 3 (three) times a week. Take 1 tablet twice daily (3 hours apart) every Tuesday, Thursday, and Saturday.      . cholecalciferol (VITAMIN D) 1000 UNITS tablet Take by mouth 2 (two) times daily. Take 2,000 units every morning and take 1,000 units daily at bedtime.      . clopidogrel (PLAVIX) 75 MG tablet Take 75 mg by mouth daily.      Marland Kitchen ethambutol (MYAMBUTOL) 400 MG tablet Take 1,200 mg by mouth 3 (three) times a week. Take on Mondays, Wednesdays, and Saturdays.      . finasteride (PROSCAR) 5 MG tablet Take 5 mg by mouth daily.      . folic acid (FOLVITE) 800 MCG tablet Take 400 mcg by mouth daily.      Marland Kitchen levothyroxine (SYNTHROID, LEVOTHROID) 112 MCG tablet Take 112 mcg by mouth daily.      Marland Kitchen loratadine (CLARITIN) 10 MG tablet Take 10 mg by mouth daily.      . magnesium oxide (MAG-OX) 400 MG tablet Take 400 mg by mouth daily at 12 noon.      Marland Kitchen OVER  THE COUNTER MEDICATION Take 1 tablet by mouth 2 (two) times daily. Macularprotect Complete 0.4mg  Capsule      . polyethylene glycol (MIRALAX / GLYCOLAX) packet Take 17 g by mouth. 1-2 times daily      . senna (SENOKOT) 8.6 MG TABS Take 2 tablets by mouth at bedtime.      . sotalol (BETAPACE) 80 MG tablet Take 80 mg by mouth 2 (two) times daily.      . Tamsulosin HCl (FLOMAX) 0.4 MG CAPS Take 0.4 mg by mouth daily.      Marland Kitchen omeprazole (PRILOSEC) 20 MG capsule Take 20 mg by mouth daily.      . sodium phosphate (FLEET) 7-19 GM/118ML ENEM Place 1 enema rectally daily as needed. For constipation.          Review of Systems  Constitutional: Positive for activity change, appetite change and unexpected weight change. Negative for fever, chills, diaphoresis and fatigue.  HENT: Positive for rhinorrhea, trouble swallowing, voice change and postnasal drip. Negative for hearing loss, ear pain, nosebleeds, congestion, sore throat, facial swelling, sneezing, mouth sores, neck pain, neck stiffness, dental problem, sinus pressure, tinnitus and ear discharge.   Eyes: Negative for photophobia, discharge, itching and visual disturbance.  Respiratory: Positive for cough, shortness of breath and wheezing. Negative for apnea, choking, chest tightness and stridor.   Cardiovascular: Positive for chest pain. Negative for palpitations and leg swelling.  Gastrointestinal: Positive for constipation. Negative for nausea, vomiting, abdominal pain, blood in stool and abdominal distention.  Genitourinary: Positive for decreased urine volume. Negative for dysuria, urgency, frequency, hematuria, flank pain and difficulty urinating.  Musculoskeletal: Positive for gait problem. Negative for myalgias, back pain, joint swelling and arthralgias.  Skin: Positive for pallor. Negative for color change and rash.  Neurological: Positive for dizziness, weakness and light-headedness. Negative for tremors, seizures, syncope, speech difficulty,  numbness and headaches.  Hematological: Negative for adenopathy. Bruises/bleeds easily.  Psychiatric/Behavioral: Negative for confusion, disturbed wake/sleep cycle and agitation. The patient is not nervous/anxious.        Objective:   Physical Exam   Filed Vitals:   06/17/12 1415  BP: 102/58  Pulse: 70  Temp: 97.8 F (36.6 C)  TempSrc: Oral  Height: 6' (1.829 m)  Weight: 128 lb (58.06 kg)  SpO2: 95%    UXL:KGMW ,  in no distress,  normal affect  ENT: No lesions,  mouth clear,  oropharynx clear, no postnasal drip  Neck: No JVD, no TMG, no carotid bruits  Lungs: No use of accessory muscles, no dullness to percussion,scattered rhonchi  Cardiovascular: RRR, heart sounds normal, no murmur or gallops, no peripheral edema  Abdomen: soft and NT, no HSM,  BS normal,  PEG tube in place  Musculoskeletal: No deformities, no cyanosis or clubbing  Neuro: alert, non focal  Skin: Warm, no lesions or rashes   Chest x-ray from 06/09/2012 reviewed and shows persisting bibasilar infiltrates and bronchiectasis white count 9600 and hemoglobin 10.4 from 06/09/2012     Assessment & Plan:   MAI (mycobacterium avium-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 albuterol (PROVENTIL) (2.5 MG/3ML) 0.083% nebulizer solution Take 2.5 mg by nebulization 3 (three) times daily.      Marland Kitchen ALPRAZolam (XANAX) 1 MG tablet Take 0.5-1 mg by mouth at bedtime as needed. For sleep.      Marland Kitchen aspirin EC 81 MG  tablet Take 81 mg by mouth at bedtime.      Marland Kitchen azithromycin (ZITHROMAX) 250 MG tablet Take 250 mg by mouth 3 (three) times a week. Take 1 tablet twice daily (3 hours apart) every Tuesday, Thursday, and Saturday.      . cholecalciferol (VITAMIN D) 1000 UNITS tablet Take by mouth 2 (two) times daily. Take 2,000 units every morning and take 1,000 units daily at bedtime.      . clopidogrel (PLAVIX) 75 MG tablet Take 75 mg by mouth daily.      Marland Kitchen ethambutol (MYAMBUTOL) 400 MG tablet Take 1,200 mg by mouth 3 (three) times a week. Take on Mondays, Wednesdays, and Saturdays.      . finasteride (PROSCAR) 5 MG tablet Take 5 mg by mouth daily.      . folic acid (FOLVITE) 800 MCG tablet Take 400 mcg by mouth daily.      Marland Kitchen levothyroxine (SYNTHROID, LEVOTHROID) 112 MCG tablet Take 112 mcg by mouth daily.      Marland Kitchen loratadine (CLARITIN) 10 MG tablet Take 10 mg by mouth daily.      . magnesium oxide (MAG-OX) 400 MG tablet Take 400 mg by mouth daily at 12 noon.      Marland Kitchen OVER THE COUNTER MEDICATION Take 1 tablet by mouth 2 (two) times daily. Macularprotect Complete 0.4mg  Capsule      . polyethylene glycol (MIRALAX / GLYCOLAX) packet Take 17 g by mouth. 1-2 times daily      . rifabutin (MYCOBUTIN) 150 MG capsule Take 600 mg by mouth 3 (three) times a week.      . senna (SENOKOT) 8.6 MG TABS Take 2 tablets by mouth at bedtime.      . sotalol (BETAPACE) 80 MG tablet Take 80 mg by mouth 2 (two) times daily.      . Tamsulosin HCl (FLOMAX) 0.4 MG CAPS Take 0.4 mg by mouth daily.      Marland Kitchen DISCONTD: omeprazole (PRILOSEC) 20 MG capsule Take 20 mg by mouth daily.      Marland Kitchen DISCONTD: sodium phosphate (FLEET)  7-19 GM/118ML ENEM Place 1 enema rectally daily as needed. For constipation.

## 2012-06-17 NOTE — Telephone Encounter (Signed)
Pt was unsure of meds today during visit.  Called [facility] and was faxed copy of MAR.  Per pt's MAR from [facility], he is taking azithromycin 250 mg tablet 1/2 tablet bwice daily on Tuesday, Thursday, ans Saturday.  Per Dr. Delford Field, this needs to be changed.  Pt needs to be taking azithromycin 500 mg (he can do 250 mg twice daily) three times weekly.    Called [facility] at [phone], spoke with Sam.  Informed her of above.  She was thinking pt is taking 250 mg bid already but MAR that was sent to [place] says differently.  She will check on this and call me back tomorrow so we can all be on the same page.

## 2012-06-17 NOTE — Patient Instructions (Addendum)
Stay on Rifabutin, myambutol, azithromycin three times weekly Stay on nebulizer three times daily Return 2 months I will review labs from [facility] office

## 2012-06-17 NOTE — Assessment & Plan Note (Signed)
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

## 2012-06-18 ENCOUNTER — Telehealth: Payer: Self-pay | Admitting: Critical Care Medicine

## 2012-06-18 MED ORDER — AZITHROMYCIN 500 MG PO TABS: ORAL_TABLET | ORAL | Status: DC

## 2012-06-18 NOTE — Telephone Encounter (Signed)
THis was done for the patient

## 2012-06-18 NOTE — Telephone Encounter (Signed)
Called, spoke with Jerry Barber to follow up on this.  Jerry Barber states pt is currently on the azithromycin 250 mg 3 x weekly.  Advised he needs to be on azithromycin 500mg  3 x weekly.  Advised he can take 1/2 tablet bid on these days. Jerry Barber verbalized understanding of these new instructions and requesting these orders to be faxed to her attn at [phone]. I have update pt's med list and printed rx for azithromycin 500 mg off for PW to sign.

## 2012-06-18 NOTE — Telephone Encounter (Signed)
Rx signed by Dr. Delford Field and faxed to Sam's attn at # provided below -- Sam aware and will call back if anything further is needed.

## 2012-06-18 NOTE — Telephone Encounter (Signed)
Called, spoke with pt.  He would like to speak directly to Dr. Delford Field about his condition and "about the end."  Dr. Delford Field, will you call Jerry Barber?  Thank you.

## 2012-07-22 ENCOUNTER — Telehealth: Payer: Self-pay | Admitting: Critical Care Medicine

## 2012-07-22 NOTE — Telephone Encounter (Signed)
I spoke with Garth Bigness at [facility] place and she states the pt was brought to [facility] about an hour ago. She states they have stopped all medications but she wants to discuss this with Dr. Delford Field because she saw that he was on MAI medications and she wants to know is it ok for him to stop these medications. She states that she can be reached at [phone], and her name is Dennie Bible. Carron Curie, CMA

## 2012-07-22 NOTE — Telephone Encounter (Signed)
This is ok to stop ALL MAI meds .

## 2012-07-22 NOTE — Telephone Encounter (Signed)
Called [facility], spoke with Jerry Barber and advised her pt may stop all MAI meds as documented by PW below.  Jerry Barber verbalized her understanding.  MAR updated.

## 2012-08-13 ENCOUNTER — Ambulatory Visit: Payer: Medicare Other | Admitting: Critical Care Medicine

## 2012-08-17 DEATH — deceased

## 2013-02-23 IMAGING — CT CT CHEST W/O CM
1 of 4 series · 12 of 32 positions shown, 15 images · non-contrast
Comparison: Chest CT 12/22/2011.

CLINICAL DATA: Follow-up pneumonia.

CT CHEST WITHOUT CONTRAST
TECHNIQUE: Multidetector CT imaging of the chest was performed
following the standard protocol without IV contrast.

[Series 2: chest w/o · axial · non-contrast · 0.64mm/px · z∈[-361,-61]mm · 12 of 72 slices shown, 15 images]
[im 6/72  mediastinal]
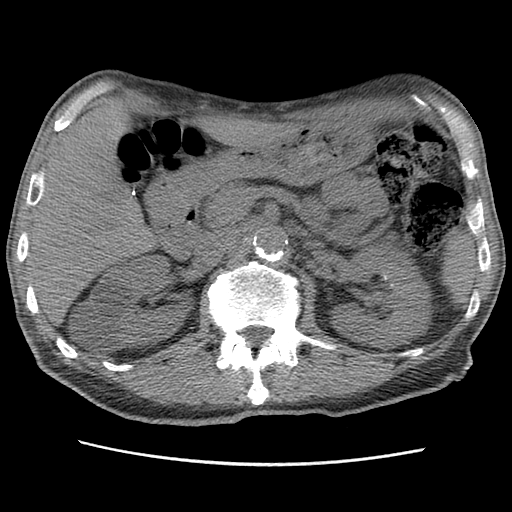
[im 6/72  lung]
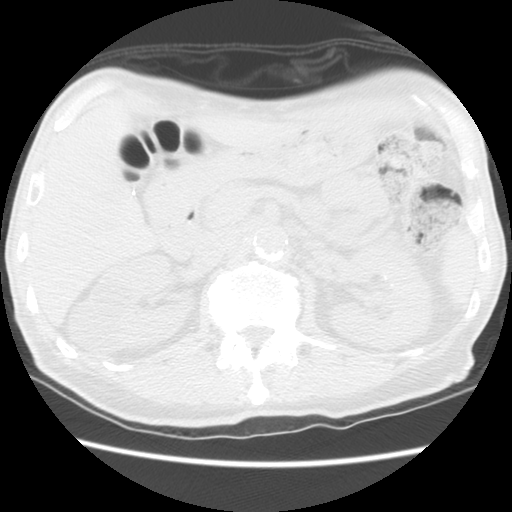
[im 11/72  lung]
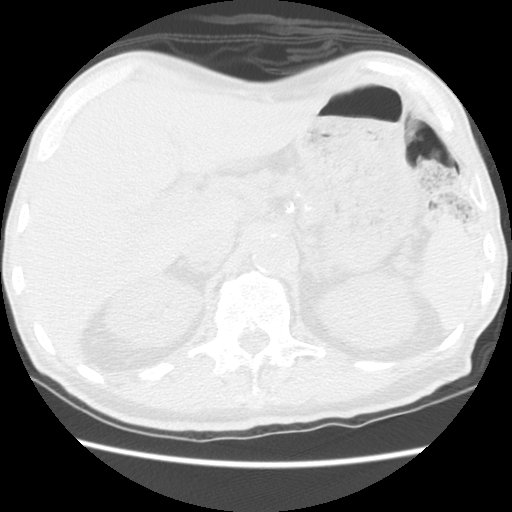
[im 17/72  lung]
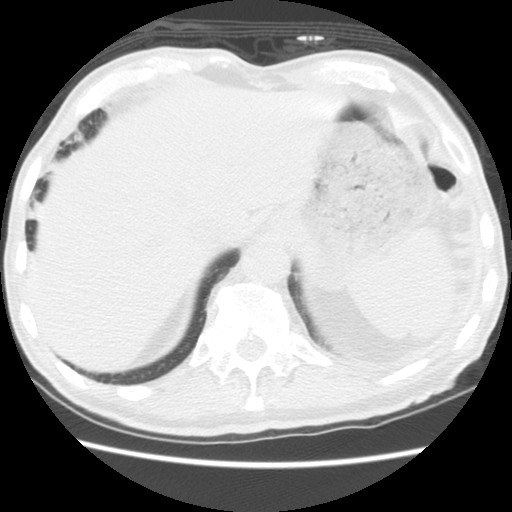
[im 22/72  lung]
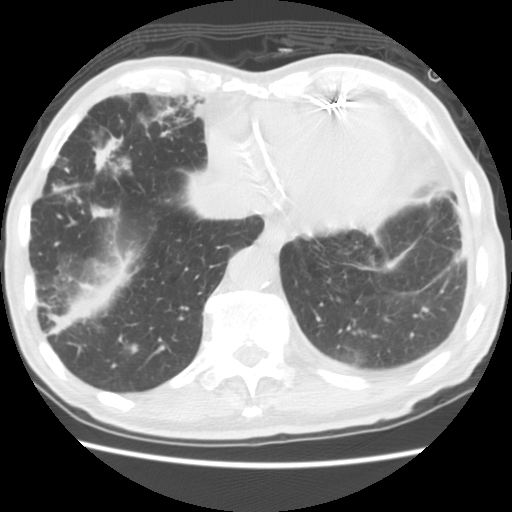
[im 28/72  mediastinal]
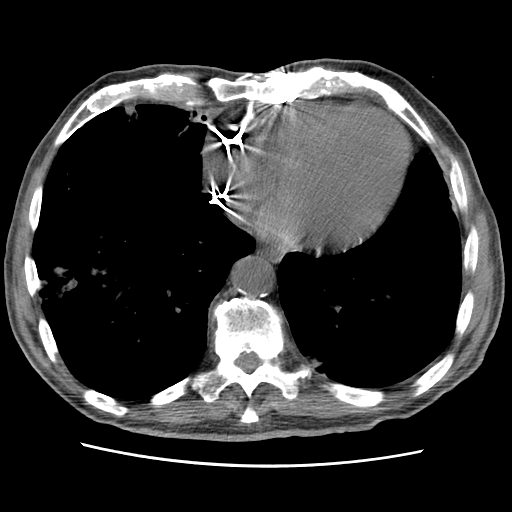
[im 28/72  lung]
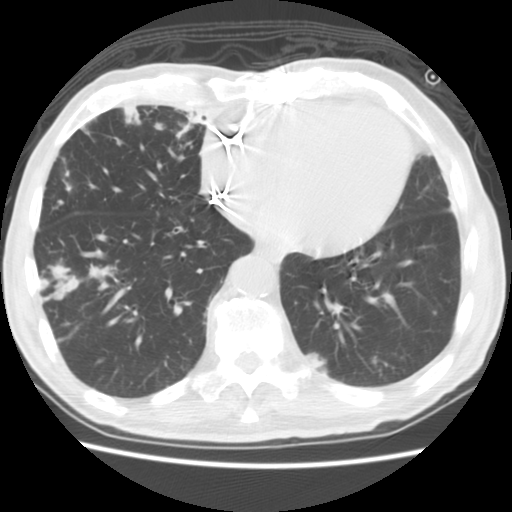
[im 33/72  lung]
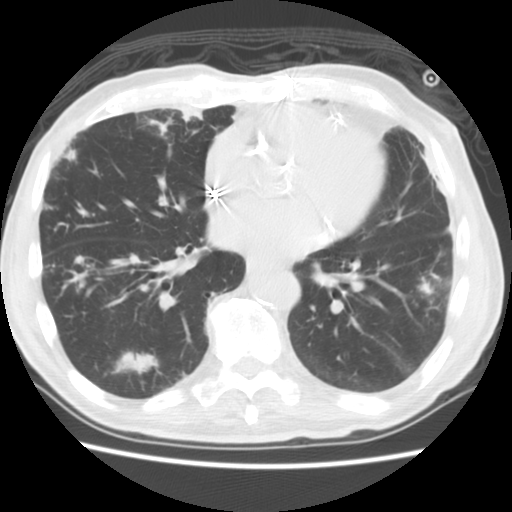
[im 39/72  lung]
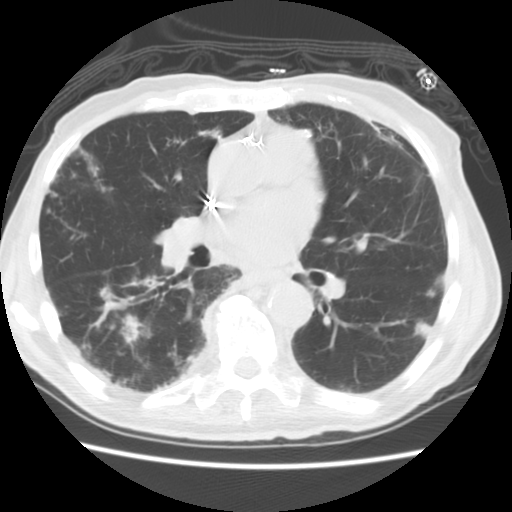
[im 44/72  lung]
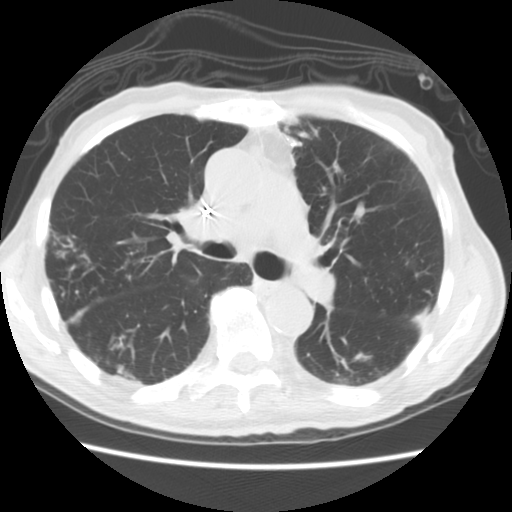
[im 50/72  mediastinal]
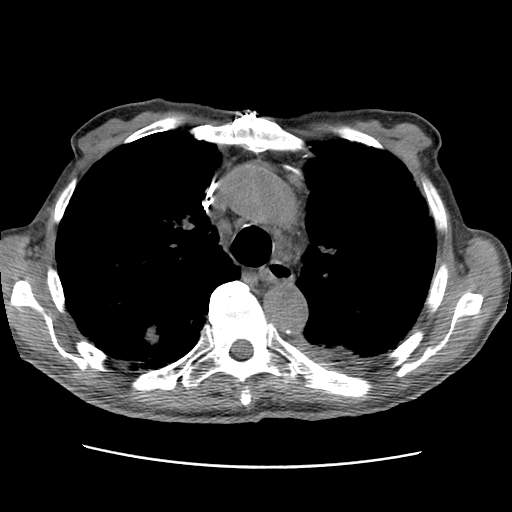
[im 50/72  lung]
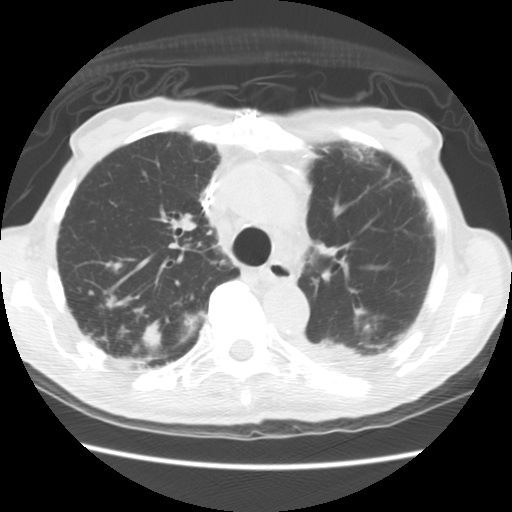
[im 55/72  lung]
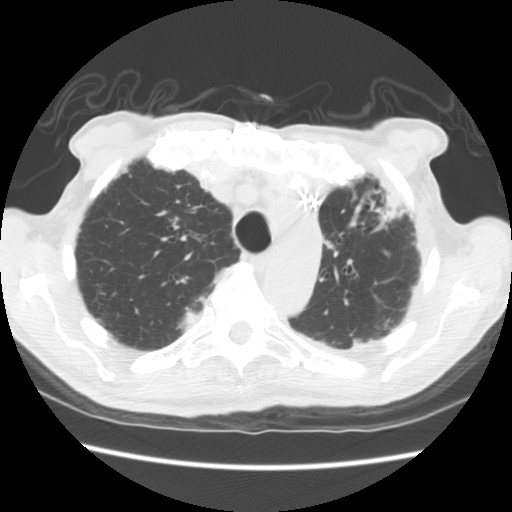
[im 61/72  lung]
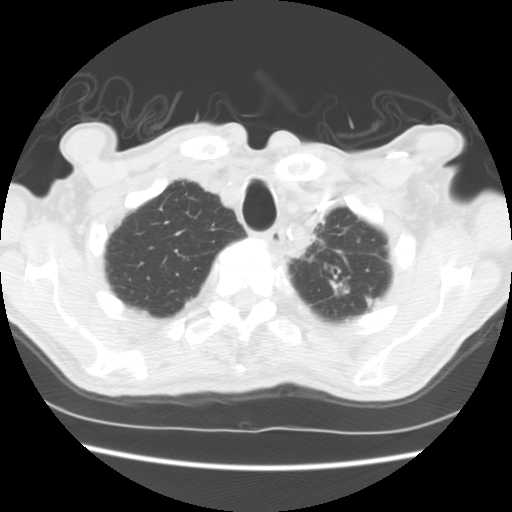
[im 66/72  lung]
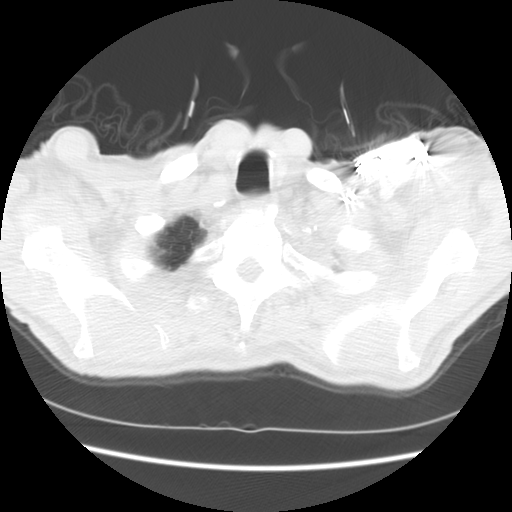

[12 of 32 positions shown; findings below may reference images not displayed]

FINDINGS: The chest wall is unremarkable and stable.  No
supraclavicular or axillary adenopathy.  Small scattered lymph
nodes are noted.  A permanent pacemaker is noted on the left.

The bony thorax is intact.  Stable degenerative changes and
osteoporosis.  The stable surgical changes from bypass surgery.
Dense coronary artery calcifications are noted.

The heart is normal in size.  No pericardial effusion.  No
mediastinal or hilar lymphadenopathy.  There are small scattered
lymph nodes.  The esophagus is grossly normal.  The aorta
demonstrates stable atherosclerotic calcifications.  No focal
aneurysm.

Examination of the lung parenchyma demonstrates diffuse patchy
airspace nodularity, peribronchial thickening, tree in bud
appearance and areas of peripheral interstitial thickening.
Findings are most likely due to an atypical infectious process such
as BING/TULASI.  Other considerations would include BOOP and fungal
infections such as aspergillosis.  Bronchoscopic evaluation is
recommended.

No pleural effusions or pulmonary edema.

The upper abdomen is stable.  Multiple renal cysts are noted.
IMPRESSION: Persistent diffuse inflammatory/infectious process likely due to
an atypical infection as discussed above.  Recommend bronchoscopic
evaluation.

## 2013-04-09 IMAGING — CR DG CHEST 2V
2 series · 2 of 2 positions shown · non-contrast
Comparison: 02/24/2012 and earlier.

CLINICAL DATA: 86-year-old male with shortness of breath, weakness.

CHEST - 2 VIEW

[w chest pa]
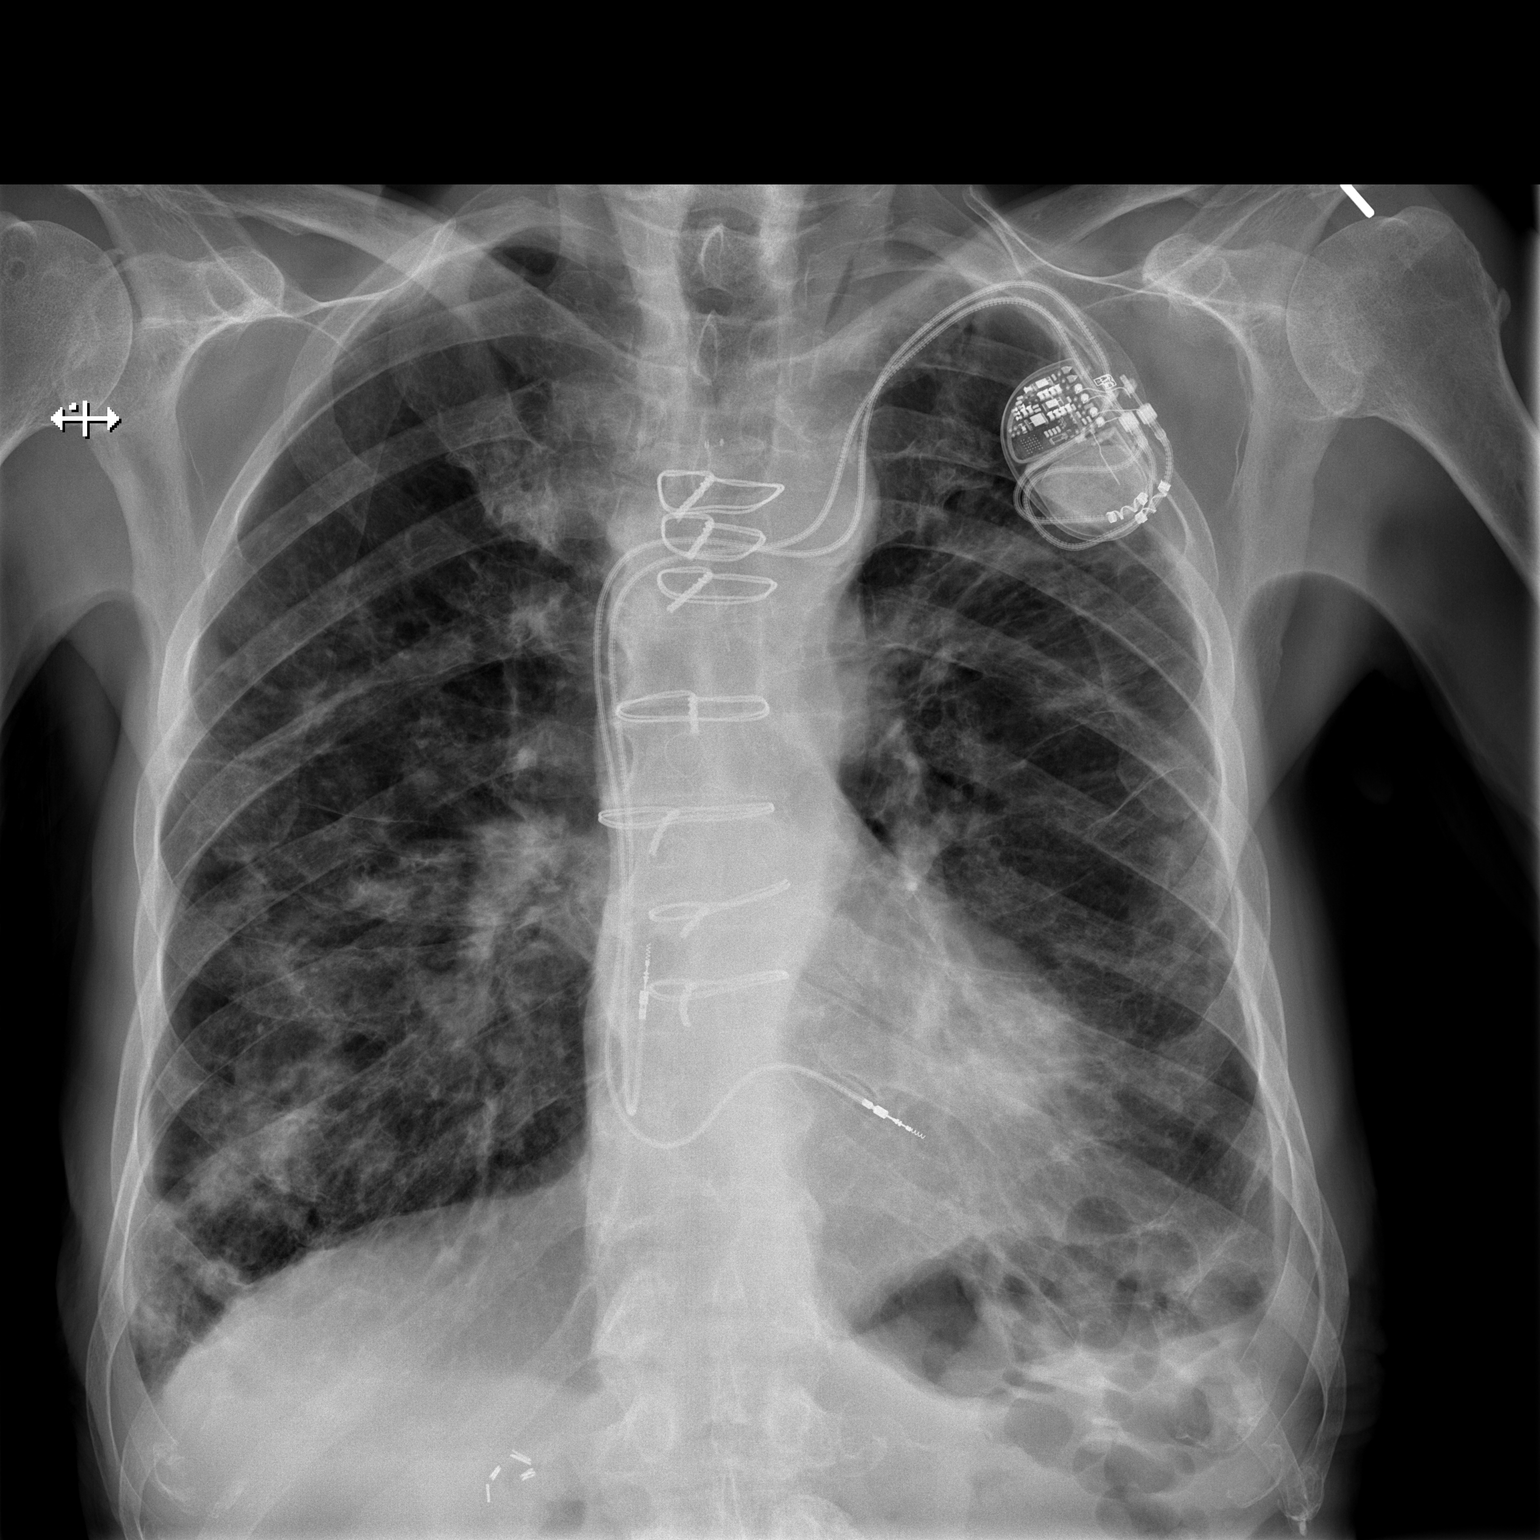

[w chest lat]
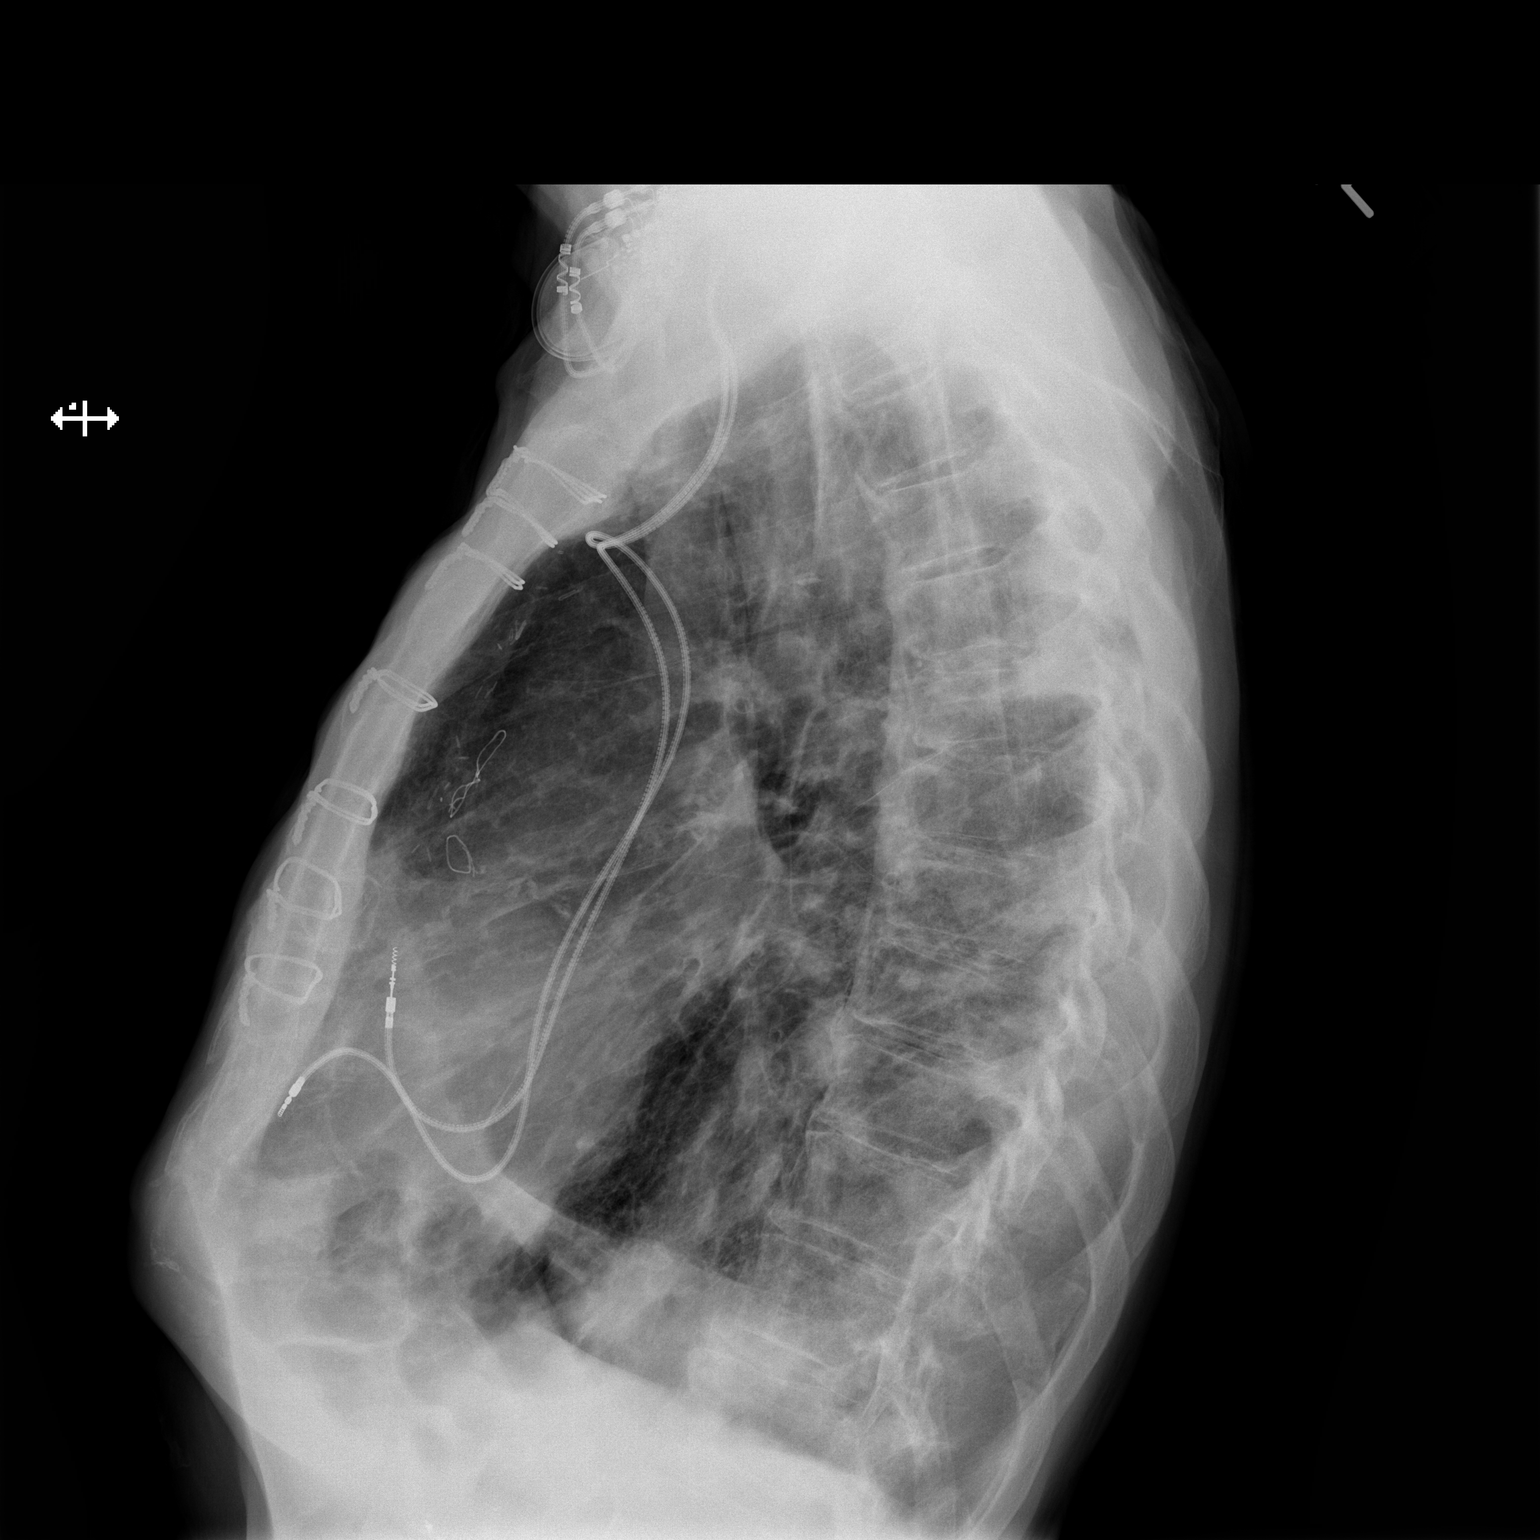

[2 of 2 positions shown; findings below may reference images not displayed]

FINDINGS: Continued patchy and nodular multi focal bilateral
pulmonary opacity, greater in the right lower lung.  Stable lung
volumes.  No pneumothorax, pleural effusion or pulmonary edema.
Stable cardiac size and mediastinal contours.  Left chest cardiac
pacemaker and sequelae of CABG. No acute osseous abnormality
identified.  Right upper quadrant surgical clips.
IMPRESSION: Continued multi focal pulmonary opacity not significantly improved
since 01/20/2012 CT (please see that report),  and progressed
compared to 12/17/2011.

## 2013-07-11 ENCOUNTER — Other Ambulatory Visit: Payer: Self-pay | Admitting: *Deleted

## 2016-08-13 NOTE — Telephone Encounter (Signed)
Error
